# Patient Record
Sex: Male | Born: 1975 | Race: White | Hispanic: No | Marital: Married | Smoking: Never smoker
Health system: Southern US, Community
[De-identification: ages and names within clinical notes are randomized; demographics above are authoritative.]

## PROBLEM LIST (undated history)

## (undated) DIAGNOSIS — E039 Hypothyroidism, unspecified: Secondary | ICD-10-CM

## (undated) DIAGNOSIS — B001 Herpesviral vesicular dermatitis: Secondary | ICD-10-CM

## (undated) DIAGNOSIS — Z973 Presence of spectacles and contact lenses: Secondary | ICD-10-CM

## (undated) DIAGNOSIS — C73 Malignant neoplasm of thyroid gland: Secondary | ICD-10-CM

## (undated) DIAGNOSIS — K219 Gastro-esophageal reflux disease without esophagitis: Secondary | ICD-10-CM

## (undated) HISTORY — DX: Malignant neoplasm of thyroid gland: C73

## (undated) HISTORY — DX: Hypothyroidism, unspecified: E03.9

## (undated) HISTORY — DX: Gastro-esophageal reflux disease without esophagitis: K21.9

## (undated) HISTORY — PX: CLOSED REDUCTION TIBIAL FRACTURE: SHX1361

## (undated) HISTORY — PX: UPPER GI ENDOSCOPY: SHX6162

## (undated) HISTORY — DX: Presence of spectacles and contact lenses: Z97.3

## (undated) HISTORY — DX: Herpesviral vesicular dermatitis: B00.1

---

## 2010-03-27 DIAGNOSIS — C73 Malignant neoplasm of thyroid gland: Secondary | ICD-10-CM

## 2010-03-27 HISTORY — DX: Malignant neoplasm of thyroid gland: C73

## 2010-03-27 HISTORY — PX: THYROIDECTOMY: SHX17

## 2010-07-19 ENCOUNTER — Emergency Department (HOSPITAL_COMMUNITY): Admission: EM | Admit: 2010-07-19 | Discharge: 2010-07-19 | Payer: Self-pay | Admitting: Emergency Medicine

## 2011-09-10 ENCOUNTER — Encounter: Payer: Self-pay | Admitting: Medical

## 2011-09-10 ENCOUNTER — Ambulatory Visit (INDEPENDENT_AMBULATORY_CARE_PROVIDER_SITE_OTHER): Payer: 59 | Admitting: Medical

## 2011-09-10 VITALS — BP 132/80 | HR 80 | Temp 98.1°F | Resp 18 | Ht 71.0 in | Wt 164.0 lb

## 2011-09-10 DIAGNOSIS — Z0000 Encounter for general adult medical examination without abnormal findings: Secondary | ICD-10-CM

## 2011-09-10 DIAGNOSIS — E039 Hypothyroidism, unspecified: Secondary | ICD-10-CM

## 2011-09-10 LAB — POCT URINALYSIS DIPSTICK
Bilirubin, UA: NEGATIVE
Blood, UA: NEGATIVE
Glucose, UA: NEGATIVE
Ketones, UA: NEGATIVE
Spec Grav, UA: <=1.005
pH, UA: 8

## 2011-09-10 NOTE — Progress Notes (Signed)
Subjective:   HPI  Bradley Watts is a 35 y.o. male who presents for a complete physical.  He is a new patient today.   He moved here from [place] a year ago.  Was seeing primary care routine prior in [place].  He recent started working on diet and exercise changes and has lost about 30 lb.  Otherwise has been healthy.  Declines flu shot.  Last tetanus within 10 years. He works as a Comptroller.   Seeing endocrinology in [place].  Just had some recently 1 year f/u and labs for thyroid.  Currently not on his thyroid medication until January for blood test.  Reviewed their medical, surgical, family, social, medication, and allergy history and updated chart as appropriate.   Past Medical History  Diagnosis Date  . 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 Hypothyroidism   . Astigmatism     wears glasses for reading  . GERD (gastroesophageal reflux disease)      Past Surgical History  Procedure Date  . Thyroidectomy 7/11  . Closed reduction tibial fracture     Family History  Problem Relation Age of Onset  . Other Father     died in MVA  . Diabetes Brother     type 1  . Cancer Paternal Grandfather     lung cancer  . Heart disease Neg Hx   . Hyperlipidemia Neg Hx   . Hypertension Neg Hx   . Stroke Neg Hx     History   Social History  . Marital Status: Married    Spouse Name: N/A    Number of Children: N/A  . Years of Education: N/A   Occupational History  . Set designer   Social History Main Topics  . Smoking status: Never Smoker   . Smokeless tobacco: Not on file  . Alcohol Use: 0.6 oz/week    1 Cans of beer per week  . Drug Use: No  . Sexually Active: Not on file   Other Topics Concern  . Not on file   Social History Narrative   Married, 2 children, ages 2yo and [place], exercise - walk the trail    No current outpatient prescriptions on file prior to visit.    No Known  Allergies  Review of Systems Constitutional: -fever, -chills, -sweats, -unexpected weight change, -anorexia, -fatigue Allergy: -sneezing, -itching, -congestion Dermatology: denies changing moles, rash, lumps, new worrisome lesions ENT: -runny nose, -ear pain, -sore throat, -hoarseness, -sinus pain, -teeth pain, -tinnitus, -hearing loss, -epistaxis Cardiology:  -chest pain, -palpitations, -edema, -orthopnea, -paroxysmal nocturnal dyspnea Respiratory: -cough, -shortness of breath, -dyspnea on exertion, -wheezing, -hemoptysis Gastroenterology: -abdominal pain, -nausea, -vomiting, -diarrhea, -constipation, -blood in stool, -changes in bowel movement, -dysphagia Hematology: -bleeding or bruising problems Musculoskeletal: -arthralgias, -myalgias, -joint swelling, -back pain, -neck pain, -cramping, -gait changes Ophthalmology: -vision changes, -eye redness, -itching, -discharge Urology: -dysuria, -difficulty urinating, -hematuria, -urinary frequency, -urgency, incontinence Neurology: -headache, -weakness, -tingling, -numbness, -speech abnormality, -memory loss, -falls, -dizziness Psychology:  -depressed mood, -agitation, -sleep problems     Objective:   Physical Exam  Filed Vitals:   09/10/11 1356  BP: 132/80  Pulse: 80  Temp: 98.1 F (36.7 C)  Resp: 18    General appearance: alert, no distress, WD/WN, white male Skin: anterior neck surgical scar, otherwise few scattered benign appearing macules, otherwise unremarkable HEENT: normocephalic, conjunctiva/corneas normal, sclerae anicteric, PERRLA, EOMi, nares patent, no discharge or erythema, pharynx normal Oral cavity:  MMM, tongue normal, teeth normal Neck: supple, no lymphadenopathy, no thyromegaly, no masses, normal ROM, no bruits Chest: non tender, normal shape and expansion Heart: RRR, normal S1, S2, no murmurs Lungs: CTA bilaterally, no wheezes, rhonchi, or rales Abdomen: +bs, soft, non tender, non distended, no masses, no  hepatomegaly, no splenomegaly, no bruits Back: non tender, normal ROM, no scoliosis Musculoskeletal: upper extremities non tender, no obvious deformity, normal ROM throughout, lower extremities non tender, no obvious deformity, normal ROM throughout Extremities: no edema, no cyanosis, no clubbing Pulses: 2+ symmetric, upper and lower extremities, normal cap refill Neurological: alert, oriented x 3, CN2-12 intact, strength normal upper extremities and lower extremities, sensation normal throughout, DTRs 2+ throughout, no cerebellar signs, gait normal Psychiatric: normal affect, behavior normal, pleasant  GU: normal male external genitalia, nontender, no masses, no hernia, no lymphadenopathy Rectal: deferred   Assessment and Plan :    Encounter Diagnoses  Name Primary?  . Routine general medical examination at a health care facility Yes  . Hypothyroidism     Physical exam - discussed healthy lifestyle, diet, exercise, preventative care, vaccinations, and addressed their concerns.  C/t efforts to lose weight and c/t regular exercise and healthy.    Hypothyroidism - f/u in January as planned with endocrinology   Follow-up for fasting labs

## 2011-09-10 NOTE — Patient Instructions (Signed)
Preventative Care for Adults, Male       REGULAR HEALTH EXAMS:  A routine yearly physical is a good way to check in with your primary care provider about your health and preventive screening. It is also an opportunity to share updates about your health and any concerns you have, and receive a thorough all-over exam.   Most health insurance companies pay for at least some preventative services.  Check with your health plan for specific coverages.  WHAT PREVENTATIVE SERVICES DO MEN NEED?  Adult men should have their weight and blood pressure checked regularly.   Men age 35 and older should have their cholesterol levels checked regularly.  Beginning at age 50 and continuing to age 75, men should be screened for colorectal cancer.  Certain people should may need continued testing until age 85.  Other cancer screening may include exams for testicular and prostate cancer.  Updating vaccinations is part of preventative care.  Vaccinations help protect against diseases such as the flu.  Lab tests are generally done as part of preventative care to screen for anemia and blood disorders, to screen for problems with the kidneys and liver, to screen for bladder problems, to check blood sugar, and to check your cholesterol level.  Preventative services generally include counseling about diet, exercise, avoiding tobacco, drugs, excessive alcohol consumption, and sexually transmitted infections.    GENERAL RECOMMENDATIONS FOR GOOD HEALTH:  Healthy diet:  Eat a variety of foods, including fruit, vegetables, animal or vegetable protein, such as meat, fish, chicken, and eggs, or beans, lentils, tofu, and grains, such as rice.  Drink plenty of water daily.  Decrease saturated fat in the diet, avoid lots of red meat, processed foods, sweets, fast foods, and fried foods.  Exercise:  Aerobic exercise helps maintain good heart health. At least 30-40 minutes of moderate-intensity exercise is recommended.  For example, a brisk walk that increases your heart rate and breathing. This should be done on most days of the week.   Find a type of exercise or a variety of exercises that you enjoy so that it becomes a part of your daily life.  Examples are running, walking, swimming, water aerobics, and biking.  For motivation and support, explore group exercise such as aerobic class, spin class, Zumba, Yoga,or  martial arts, etc.    Set exercise goals for yourself, such as a certain weight goal, walk or run in a race such as a 5k walk/run.  Speak to your primary care provider about exercise goals.  Disease prevention:  If you smoke or chew tobacco, find out from your caregiver how to quit. It can literally save your life, no matter how long you have been a tobacco user. If you do not use tobacco, never begin.   Maintain a healthy diet and normal weight. Increased weight leads to problems with blood pressure and diabetes.   The Body Mass Index or BMI is a way of measuring how much of your body is fat. Having a BMI above 27 increases the risk of heart disease, diabetes, hypertension, stroke and other problems related to obesity. Your caregiver can help determine your BMI and based on it develop an exercise and dietary program to help you achieve or maintain this important measurement at a healthful level.  High blood pressure causes heart and blood vessel problems.  Persistent high blood pressure should be treated with medicine if weight loss and exercise do not work.   Fat and cholesterol leaves deposits in your arteries   that can block them. This causes heart disease and vessel disease elsewhere in your body.  If your cholesterol is found to be high, or if you have heart disease or certain other medical conditions, then you may need to have your cholesterol monitored frequently and be treated with medication.   Ask if you should have a stress test if your history suggests this. A stress test is a test done on  a treadmill that looks for heart disease. This test can find disease prior to there being a problem.  Avoid drinking alcohol in excess (more than two drinks per day).  Avoid use of street drugs. Do not share needles with anyone. Ask for professional help if you need assistance or instructions on stopping the use of alcohol, cigarettes, and/or drugs.  Brush your teeth twice a day with fluoride toothpaste, and floss once a day. Good oral hygiene prevents tooth decay and gum disease. The problems can be painful, unattractive, and can cause other health problems. Visit your dentist for a routine oral and dental check up and preventive care every 6-12 months.   Look at your skin regularly.  Use a mirror to look at your back. Notify your caregivers of changes in moles, especially if there are changes in shapes, colors, a size larger than a pencil eraser, an irregular border, or development of new moles.  Safety:  Use seatbelts 100% of the time, whether driving or as a passenger.  Use safety devices such as hearing protection if you work in environments with loud noise or significant background noise.  Use safety glasses when doing any work that could send debris in to the eyes.  Use a helmet if you ride a bike or motorcycle.  Use appropriate safety gear for contact sports.  Talk to your caregiver about gun safety.  Use sunscreen with a SPF (or skin protection factor) of 15 or greater.  Lighter skinned people are at a greater risk of skin cancer. Don't forget to also wear sunglasses in order to protect your eyes from too much damaging sunlight. Damaging sunlight can accelerate cataract formation.   Practice safe sex. Use condoms. Condoms are used for birth control and to help reduce the spread of sexually transmitted infections (or STIs).  Some of the STIs are gonorrhea (the clap), chlamydia, syphilis, trichomonas, herpes, HPV (human papilloma virus) and HIV (human immunodeficiency virus) which causes AIDS.  The herpes, HIV and HPV are viral illnesses that have no cure. These can result in disability, cancer and death.   Keep carbon monoxide and smoke detectors in your home functioning at all times. Change the batteries every 6 months or use a model that plugs into the wall.   Vaccinations:  Stay up to date with your tetanus shots and other required immunizations. You should have a booster for tetanus every 10 years. Be sure to get your flu shot every year, since 5%-20% of the [place] population comes down with the flu. The flu vaccine changes each year, so being vaccinated once is not enough. Get your shot in the fall, before the flu season peaks.   Other vaccines to consider:  Pneumococcal vaccine to protect against certain types of pneumonia.  This is normally recommended for adults age 65 or older.  However, adults younger than 36 years old with certain underlying conditions such as diabetes, heart or lung disease should also receive the vaccine.  Shingles vaccine to protect against Varicella Zoster if you are older than age 60, or younger   than 36 years old with certain underlying illness.  Hepatitis A vaccine to protect against a form of infection of the liver by a virus acquired from food.  Hepatitis B vaccine to protect against a form of infection of the liver by a virus acquired from blood or body fluids, particularly if you work in health care.  If you plan to travel internationally, check with your local health department for specific vaccination recommendations.  Cancer Screening:  Most routine colon cancer screening begins at the age of 50. On a yearly basis, doctors may provide special easy to use take-home tests to check for hidden blood in the stool. Sigmoidoscopy or colonoscopy can detect the earliest forms of colon cancer and is life saving. These tests use a small camera at the end of a tube to directly examine the colon. Speak to your caregiver about this at age 50, when routine  screening begins (and is repeated every 5 years unless early forms of pre-cancerous polyps or small growths are found).   At the age of 50 men usually start screening for prostate cancer every year. Screening may begin at a younger age for those with higher risk. Those at higher risk include African-Americans or having a family history of prostate cancer. There are two types of tests for prostate cancer:   Prostate-specific antigen (PSA) testing. Recent studies raise questions about prostate cancer using PSA and you should discuss this with your caregiver.   Digital rectal exam (in which your doctor's lubricated and gloved finger feels for enlargement of the prostate through the anus).   Screening for testicular cancer.  Do a monthly exam of your testicles. Gently roll each testicle between your thumb and fingers, feeling for any abnormal lumps. The best time to do this is after a hot shower or bath when the tissues are looser. Notify your caregivers of any lumps, tenderness or changes in size or shape immediately.     

## 2011-09-14 ENCOUNTER — Other Ambulatory Visit: Payer: 59

## 2011-09-14 DIAGNOSIS — Z0000 Encounter for general adult medical examination without abnormal findings: Secondary | ICD-10-CM

## 2011-09-14 LAB — COMPREHENSIVE METABOLIC PANEL
AST: 17 U/L (ref 0–37)
Albumin: 4.2 g/dL (ref 3.5–5.2)
BUN: 21 mg/dL (ref 6–23)
CO2: 29 mEq/L (ref 19–32)
Calcium: 8.9 mg/dL (ref 8.4–10.5)
Chloride: 103 mEq/L (ref 96–112)
Creat: 0.9 mg/dL (ref 0.50–1.35)
Potassium: 4.3 mEq/L (ref 3.5–5.3)

## 2011-09-14 LAB — LIPID PANEL
HDL: 32 mg/dL — ABNORMAL LOW (ref >39)
LDL Cholesterol: 142 mg/dL — ABNORMAL HIGH (ref 0–99)
Triglycerides: 154 mg/dL — ABNORMAL HIGH (ref <150)

## 2011-09-15 LAB — CBC WITH DIFFERENTIAL/PLATELET
Basophils Absolute: 0 10*3/uL (ref 0.0–0.1)
Basophils Relative: 0 % (ref 0–1)
HCT: 48.3 % (ref 39.0–52.0)
Hemoglobin: 16.3 g/dL (ref 13.0–17.0)
Lymphocytes Relative: 40 % (ref 12–46)
MCHC: 33.7 g/dL (ref 30.0–36.0)
Monocytes Absolute: 0.4 10*3/uL (ref 0.1–1.0)
Neutro Abs: 3.1 10*3/uL (ref 1.7–7.7)
Neutrophils Relative %: 52 % (ref 43–77)
RDW: 12.6 % (ref 11.5–15.5)
WBC: 5.9 10*3/uL (ref 4.0–10.5)

## 2011-11-01 ENCOUNTER — Other Ambulatory Visit (INDEPENDENT_AMBULATORY_CARE_PROVIDER_SITE_OTHER): Payer: 59

## 2011-11-01 DIAGNOSIS — R82998 Other abnormal findings in urine: Secondary | ICD-10-CM

## 2011-11-01 DIAGNOSIS — R8290 Unspecified abnormal findings in urine: Secondary | ICD-10-CM

## 2011-11-01 LAB — POCT URINALYSIS DIPSTICK
Bilirubin, UA: NEGATIVE
Blood, UA: NEGATIVE
Ketones, UA: NEGATIVE
pH, UA: 7

## 2012-09-21 ENCOUNTER — Encounter: Payer: Self-pay | Admitting: Internal Medicine

## 2012-10-04 ENCOUNTER — Encounter: Payer: Self-pay | Admitting: Medical

## 2012-10-04 ENCOUNTER — Ambulatory Visit (INDEPENDENT_AMBULATORY_CARE_PROVIDER_SITE_OTHER): Payer: PRIVATE HEALTH INSURANCE | Admitting: Medical

## 2012-10-04 VITALS — BP 110/80 | HR 58 | Temp 98.2°F | Resp 16 | Ht 70.0 in | Wt 221.0 lb

## 2012-10-04 DIAGNOSIS — R001 Bradycardia, unspecified: Secondary | ICD-10-CM

## 2012-10-04 DIAGNOSIS — K219 Gastro-esophageal reflux disease without esophagitis: Secondary | ICD-10-CM

## 2012-10-04 DIAGNOSIS — I498 Other specified cardiac arrhythmias: Secondary | ICD-10-CM

## 2012-10-04 DIAGNOSIS — E039 Hypothyroidism, unspecified: Secondary | ICD-10-CM

## 2012-10-04 DIAGNOSIS — Z0000 Encounter for general adult medical examination without abnormal findings: Secondary | ICD-10-CM

## 2012-10-04 LAB — POCT URINALYSIS DIPSTICK
Bilirubin, UA: NEGATIVE
Blood, UA: NEGATIVE
Glucose, UA: NEGATIVE
Ketones, UA: NEGATIVE
Spec Grav, UA: 1.015

## 2012-10-04 NOTE — Patient Instructions (Addendum)
Dr. Yancey Flemings, dentist [address], [place], [place] [address] [phone] [email or web address]   Continue exercising and healthy diet.  Work towards your goal weight.  Return at your convenience for labs.

## 2012-10-04 NOTE — Progress Notes (Signed)
Subjective: Here for physical.  I saw him as a new patient a year ago.  Last visit he was over 275 lb, and now he is down to 221lb.   He plans to get down to 200lb.  He is exercising regularly.   Runs twice weekly, runs 10 miles every other weekend, and runs 2-3 miles alternate weekends, does weights.  Has really changed diet, eating healthy, and continues to work towards his goal.  2 years ago was not exercising at all.    He continues to see his endocrinologist regularly, had labs 3 months ago.  He is nonfasting today.   Sees eye doctor yearly, last seen 08/2012.  Hasn't been to dentist in a while.    GERD is much better since he has changed diet and lost weight. Declines flu shot.  Last tetanus within 10 years. He works as a Comptroller.   Reviewed their medical, surgical, family, social, medication, and allergy history and updated chart as appropriate.   Past Medical History  Diagnosis Date  . 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 Hypothyroidism   . Astigmatism     wears glasses for reading  . GERD (gastroesophageal reflux disease)      Past Surgical History  Procedure Date  . Thyroidectomy 7/11  . Closed reduction tibial fracture     Family History  Problem Relation Age of Onset  . Other Father     died in MVA  . Diabetes Brother     type 1  . Cancer Paternal Grandfather     lung cancer  . Hyperlipidemia Neg Hx   . Hypertension Neg Hx   . Stroke Neg Hx   . Heart disease Maternal Grandfather 81    History   Social History  . Marital Status: Married    Spouse Name: N/A    Number of Children: N/A  . Years of Education: N/A   Occupational History  . Set designer   Social History Main Topics  . Smoking status: Never Smoker   . Smokeless tobacco: Not on file  . Alcohol Use: 0.6 oz/week    1 Cans of beer per week  . Drug Use: No  . Sexually Active: Not on file   Other Topics Concern   . Not on file   Social History Narrative   Married, 2 children, ages 3yo and 6yo, Comptroller, exercise - running, yoga, weight lifting    Current Outpatient Prescriptions on File Prior to Visit  Medication Sig Dispense Refill  . lansoprazole (PREVACID) 15 MG capsule Take 15 mg by mouth daily.       Marland Kitchen levothyroxine (SYNTHROID, LEVOTHROID) 200 MCG tablet Take 200 mcg by mouth daily.          No Known Allergies  Review of Systems Constitutional: -fever, -chills, -sweats, -unexpected weight change, -anorexia, -fatigue Allergy: -sneezing, -itching, -congestion Dermatology: denies changing moles, rash, lumps, new worrisome lesions ENT: -runny nose, -ear pain, -sore throat, -hoarseness, -sinus pain, -teeth pain, -tinnitus, -hearing loss, -epistaxis Cardiology:  -chest pain, -palpitations, -edema, -orthopnea, -paroxysmal nocturnal dyspnea Respiratory: -cough, -shortness of breath, -dyspnea on exertion, -wheezing, -hemoptysis Gastroenterology: -abdominal pain, -nausea, -vomiting, -diarrhea, -constipation, -blood in stool, -changes in bowel movement, -dysphagia Hematology: -bleeding or bruising problems Musculoskeletal: -arthralgias, -myalgias, -joint swelling, -back pain, -neck pain, -cramping, -gait changes Ophthalmology: -vision changes, -eye redness, -itching, -discharge Urology: -dysuria, -difficulty urinating, -hematuria, -urinary frequency, -urgency, incontinence Neurology: -headache, -  weakness, -tingling, -numbness, -speech abnormality, -memory loss, -falls, -dizziness Psychology:  -depressed mood, -agitation, -sleep problems     Objective:   Physical Exam  Filed Vitals:   10/04/12 1411  BP: 110/80  Pulse: 58  Temp: 98.2 F (36.8 C)  Resp: 16    General appearance: alert, no distress, WD/WN, white male Skin: anterior neck surgical scar, otherwise few scattered benign appearing macules, otherwise unremarkable HEENT: normocephalic, conjunctiva/corneas normal,  sclerae anicteric, PERRLA, EOMi, nares patent, no discharge or erythema, pharynx normal Oral cavity: MMM, tongue normal, teeth in good repair Neck: supple, no lymphadenopathy, no thyromegaly, no masses, normal ROM, no bruits Chest: non tender, normal shape and expansion Heart: RRR, normal S1, S2, no murmurs Lungs: CTA bilaterally, no wheezes, rhonchi, or rales Abdomen: +bs, soft, striae, non tender, non distended, no masses, no hepatomegaly, no splenomegaly, no bruits Back: non tender, normal ROM, no scoliosis, striae  Musculoskeletal: upper extremities non tender, no obvious deformity, normal ROM throughout, lower extremities non tender, no obvious deformity, normal ROM throughout Extremities: no edema, no cyanosis, no clubbing Pulses: 2+ symmetric, upper and lower extremities, normal cap refill Neurological: alert, oriented x 3, CN2-12 intact, strength normal upper extremities and lower extremities, sensation normal throughout, DTRs 2+ throughout, no cerebellar signs, gait normal Psychiatric: normal affect, behavior normal, pleasant  GU: normal male external genitalia, circumcised, nontender, no masses, no hernia, no lymphadenopathy Rectal: deferred   Adult ECG Report  Indication: bradycardia  Rate: 54 bpm  Rhythm: sinus bradycardia  QRS Axis: 63 degrees  PR Interval: 150 ms  QRS Duration: 92ms  QTc:  Conduction Disturbances: none  Other Abnormalities: none  Patient's cardiac risk factors are: male gender.  EKG comparison: none  Narrative Interpretation: sinus bradycardia   Assessment and Plan :    Encounter Diagnoses  Name Primary?  . Routine general medical examination at a health care facility Yes  . Bradycardia   . GERD (gastroesophageal reflux disease)   . Hypothyroidism    Physical exam - discussed healthy lifestyle, diet, exercise, preventative care, vaccinations, and addressed their concerns.  C/t efforts to lose weight and c/t regular exercise and healthy.     Bradycardia - likely multifactorial, conditioning from his aerobic activity, on synthroid, and due to weight loss, no worrisome findings  GERD - c/t current medication, improved with diet changes  Hypothyroidism - followed by endocrinology  Follow-up for fasting labs

## 2012-12-25 ENCOUNTER — Other Ambulatory Visit: Payer: Managed Care, Other (non HMO)

## 2012-12-25 DIAGNOSIS — Z0000 Encounter for general adult medical examination without abnormal findings: Secondary | ICD-10-CM

## 2012-12-25 LAB — LIPID PANEL
Cholesterol: 174 mg/dL (ref 0–200)
Triglycerides: 47 mg/dL (ref <150)
VLDL: 9 mg/dL (ref 0–40)

## 2012-12-25 LAB — CBC WITH DIFFERENTIAL/PLATELET
Basophils Relative: 0 % (ref 0–1)
Hemoglobin: 15.8 g/dL (ref 13.0–17.0)
MCHC: 35.3 g/dL (ref 30.0–36.0)
Monocytes Relative: 6 % (ref 3–12)
Neutro Abs: 1.8 10*3/uL (ref 1.7–7.7)
Neutrophils Relative %: 44 % (ref 43–77)
Platelets: 182 10*3/uL (ref 150–400)
RBC: 5.17 MIL/uL (ref 4.22–5.81)

## 2012-12-25 LAB — COMPREHENSIVE METABOLIC PANEL
Albumin: 4.4 g/dL (ref 3.5–5.2)
BUN: 13 mg/dL (ref 6–23)
Calcium: 9.3 mg/dL (ref 8.4–10.5)
Chloride: 105 mEq/L (ref 96–112)
Glucose, Bld: 82 mg/dL (ref 70–99)
Potassium: 4.5 mEq/L (ref 3.5–5.3)

## 2013-04-23 ENCOUNTER — Other Ambulatory Visit (INDEPENDENT_AMBULATORY_CARE_PROVIDER_SITE_OTHER): Payer: Self-pay

## 2013-04-23 DIAGNOSIS — Z23 Encounter for immunization: Secondary | ICD-10-CM

## 2014-02-14 ENCOUNTER — Encounter: Payer: Self-pay | Admitting: Medical

## 2014-02-14 ENCOUNTER — Ambulatory Visit (INDEPENDENT_AMBULATORY_CARE_PROVIDER_SITE_OTHER): Payer: BC Managed Care – PPO | Admitting: Medical

## 2014-02-14 VITALS — BP 128/86 | HR 60 | Temp 98.0°F | Resp 18 | Ht 70.5 in | Wt 230.0 lb

## 2014-02-14 DIAGNOSIS — K219 Gastro-esophageal reflux disease without esophagitis: Secondary | ICD-10-CM

## 2014-02-14 DIAGNOSIS — E039 Hypothyroidism, unspecified: Secondary | ICD-10-CM

## 2014-02-14 DIAGNOSIS — B001 Herpesviral vesicular dermatitis: Secondary | ICD-10-CM

## 2014-02-14 DIAGNOSIS — Z0000 Encounter for general adult medical examination without abnormal findings: Secondary | ICD-10-CM

## 2014-02-14 DIAGNOSIS — B009 Herpesviral infection, unspecified: Secondary | ICD-10-CM

## 2014-02-14 LAB — BASIC METABOLIC PANEL
BUN: 17 mg/dL (ref 6–23)
CHLORIDE: 101 meq/L (ref 96–112)
CO2: 28 meq/L (ref 19–32)
Calcium: 9.5 mg/dL (ref 8.4–10.5)
Creat: 0.78 mg/dL (ref 0.50–1.35)
GLUCOSE: 94 mg/dL (ref 70–99)
Potassium: 4.4 mEq/L (ref 3.5–5.3)
SODIUM: 136 meq/L (ref 135–145)

## 2014-02-14 LAB — LIPID PANEL
Cholesterol: 178 mg/dL (ref 0–200)
HDL: 43 mg/dL (ref >39)
LDL Cholesterol: 122 mg/dL — ABNORMAL HIGH (ref 0–99)
TRIGLYCERIDES: 63 mg/dL (ref <150)
Total CHOL/HDL Ratio: 4.1 Ratio
VLDL: 13 mg/dL (ref 0–40)

## 2014-02-14 LAB — CBC
HEMATOCRIT: 48.1 % (ref 39.0–52.0)
Hemoglobin: 17 g/dL (ref 13.0–17.0)
MCH: 30.9 pg (ref 26.0–34.0)
MCHC: 35.3 g/dL (ref 30.0–36.0)
MCV: 87.5 fL (ref 78.0–100.0)
Platelets: 180 10*3/uL (ref 150–400)
RBC: 5.5 MIL/uL (ref 4.22–5.81)
RDW: 13 % (ref 11.5–15.5)
WBC: 3.7 10*3/uL — ABNORMAL LOW (ref 4.0–10.5)

## 2014-02-14 LAB — POCT URINALYSIS DIPSTICK
BILIRUBIN UA: NEGATIVE
Blood, UA: NEGATIVE
GLUCOSE UA: NEGATIVE
Ketones, UA: NEGATIVE
LEUKOCYTES UA: NEGATIVE
NITRITE UA: NEGATIVE
Protein, UA: NEGATIVE
Spec Grav, UA: <=1.005
UROBILINOGEN UA: NEGATIVE
pH, UA: 7.5

## 2014-02-14 MED ORDER — AMOXICILLIN-POT CLAVULANATE ER 1000-62.5 MG PO TB12: 2.0000 | ORAL_TABLET | Freq: Two times a day (BID) | ORAL | Status: DC

## 2014-02-14 MED ORDER — DEXLANSOPRAZOLE 60 MG PO CPDR: 60.0000 mg | DELAYED_RELEASE_CAPSULE | Freq: Every day | ORAL | Status: DC

## 2014-02-14 MED ORDER — ACYCLOVIR 400 MG PO TABS: ORAL_TABLET | ORAL | Status: DC

## 2014-02-14 NOTE — Patient Instructions (Signed)
  Thank you for giving me the opportunity to serve you today.    Your diagnosis today includes: Encounter Diagnoses  Name Primary?  . Routine general medical examination at a health care facility Yes  . GERD (gastroesophageal reflux disease)   . Unspecified hypothyroidism   . Recurrent cold sores      Specific recommendations today include:  Begin Dexilant once daily for GERD  Continue Acyclovir, but consider Valtrex as an option  See dentist soon, I listed a dentist below that I prefer.     I have included other useful information below for your review.  Dr. Jonna Coup, dentist [address] [place], [place] [address] [phone] [email or web address]

## 2014-02-14 NOTE — Progress Notes (Signed)
Subjective:   HPI  Bradley Watts is a 38 y.o. male who presents for a complete physical.  Medical care team includes:  Endocrinology  Dorothea Ogle, PA-C here for primary care   Has an employment work form to complete for physical.   Preventative care: Last ophthalmology visit:2014, [facility] Last dental visit:2013 no dentist Last colonoscopy:no Last prostate exam: no Last EKG:2014 Last labs:2015  Prior vaccinations: TD or Tdap:here 2014 Influenza:no Pneumococcal:no Shingles/Zostavax:no Other:   Advanced directive: no Health care power of attorney:no Living will:no  Concerns: Hx/o recurrent cold sores.  Uses acyclovir, would have #30 + 5 refills, but frequency of flare ups varies from time to time.  gerd - using combination of both prevacid and prilosec.  Has failed tums, zantac, other OTC agents.  Sees endocrinology regularly/yearly for thyroid, just had recent labs.   Reviewed their medical, surgical, family, social, medication, and allergy history and updated chart as appropriate.  Past Medical History  Diagnosis Date  . Astigmatism     wears glasses for reading  . GERD (gastroesophageal reflux disease)   . Wears glasses   . Recurrent cold sores   . 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 Jerene Canny  . Hypothyroidism     Past Surgical History  Procedure Laterality Date  . Thyroidectomy  7/11  . Closed reduction tibial fracture      History   Social History  . Marital Status: Married    Spouse Name: N/A    Number of Children: N/A  . Years of Education: N/A   Occupational History  . Designer, industrial/product   Social History Main Topics  . Smoking status: Never Smoker   . Smokeless tobacco: Not on file  . Alcohol Use: 0.6 oz/week    1 Cans of beer per week  . Drug Use: No  . Sexual Activity: Not on file   Other Topics Concern  . Not on file   Social History Narrative   Married,  2 children, ages 58yo and 22yo, [place], exercise - running, yoga, weight lifting    Family History  Problem Relation Age of Onset  . Other Father     died in [place]  . Diabetes Brother     type 1  . Cancer Paternal Grandfather     lung cancer  . Hyperlipidemia Neg Hx   . Hypertension Neg Hx   . Stroke Neg Hx   . Heart disease Maternal Grandfather 20    Current outpatient prescriptions:acyclovir (ZOVIRAX) 400 MG tablet, BID for suppression, TID x 7 days for flare up, Disp: 75 tablet, Rfl: 5;  levothyroxine (SYNTHROID, LEVOTHROID) 200 MCG tablet, Take 200 mcg by mouth daily.  , Disp: , Rfl: ;  dexlansoprazole (DEXILANT) 60 MG capsule, Take 1 capsule (60 mg total) by mouth daily., Disp: 90 capsule, Rfl: 3  No Known Allergies     Review of Systems Constitutional: -fever, -chills, -sweats, -unexpected weight change, -decreased appetite, -fatigue Allergy: -sneezing, -itching, -congestion Dermatology: -changing moles, --rash, -lumps ENT: -runny nose, -ear pain, -sore throat, -hoarseness, -sinus pain, -teeth pain, - ringing in ears, -hearing loss, -nosebleeds Cardiology: -chest pain, -palpitations, -swelling, -difficulty breathing when lying flat, -waking up short of breath Respiratory: -cough, -shortness of breath, -difficulty breathing with exercise or exertion, -wheezing, -coughing up blood Gastroenterology: -abdominal pain, -nausea, -vomiting, -diarrhea, -constipation, -blood in stool, -changes in bowel movement, -difficulty swallowing or eating Hematology: -bleeding, -bruising  Musculoskeletal: -  joint aches, -muscle aches, -joint swelling, -back pain, -neck pain, -cramping, -changes in gait Ophthalmology: denies vision changes, eye redness, itching, discharge Urology: -burning with urination, -difficulty urinating, -blood in urine, -urinary frequency, -urgency, -incontinence Neurology: -headache, -weakness, -tingling, -numbness, -memory loss, -falls, -dizziness Psychology:  -depressed mood, -agitation, -sleep problems     Objective:   Physical Exam  BP 128/86  Pulse 60  Temp(Src) 98 F (36.7 C) (Oral)  Resp 18  Ht 5' 10.5" (1.791 m)  Wt 230 lb (104.327 kg)  BMI 32.52 kg/m2  General appearance: alert, no distress, WD/WN, white male  Skin: anterior neck surgical scar, otherwise few scattered benign appearing macules, otherwise unremarkable , right anterior lower leg tattoo HEENT: normocephalic, conjunctiva/corneas normal, sclerae anicteric, PERRLA, EOMi, nares patent, no discharge or erythema, pharynx normal  Oral cavity: MMM, tongue normal, teeth in good repair  Neck: supple, anterior surgical scar, no lymphadenopathy, no thyromegaly, no masses, normal ROM, no bruits  Chest: non tender, normal shape and expansion  Heart: RRR, normal S1, S2, no murmurs  Lungs: CTA bilaterally, no wheezes, rhonchi, or rales  Abdomen: +bs, soft, striae, non tender, non distended, no masses, no hepatomegaly, no splenomegaly, no bruits  Back: non tender, normal ROM, no scoliosis, striae  Musculoskeletal: upper extremities non tender, no obvious deformity, normal ROM throughout, lower extremities non tender, no obvious deformity, normal ROM throughout  Extremities: somewhat flushed appearing hands, but no edema, no cyanosis, no clubbing  Pulses: 2+ symmetric, upper and lower extremities, normal cap refill  Neurological: alert, oriented x 3, CN2-12 intact, strength normal upper extremities and lower extremities, sensation normal throughout, DTRs 2+ throughout, no cerebellar signs, gait normal  Psychiatric: normal affect, behavior normal, pleasant  GU: normal male external genitalia, circumcised, nontender, no masses, no hernia, no lymphadenopathy  Rectal: deferred    Assessment and Plan :      Encounter Diagnoses  Name Primary?  . Routine general medical examination at a health care facility Yes  . GERD (gastroesophageal reflux disease)   . Unspecified hypothyroidism    . Recurrent cold sores     Physical exam - discussed healthy lifestyle, diet, exercise, preventative care, vaccinations, and addressed their concerns.   GERD - change to Dexilant. Failed prilosec, prevacid, OTC remedies. Hypothyroidism - managed by endocrinology Cold sore - c/t Acyclovir Follow-up pending labs

## 2014-03-31 ENCOUNTER — Ambulatory Visit (INDEPENDENT_AMBULATORY_CARE_PROVIDER_SITE_OTHER): Payer: BC Managed Care – PPO | Admitting: Internal Medicine

## 2014-03-31 VITALS — BP 144/89 | HR 69 | Temp 98.1°F | Resp 18 | Ht 71.0 in | Wt 233.0 lb

## 2014-03-31 DIAGNOSIS — R369 Urethral discharge, unspecified: Secondary | ICD-10-CM

## 2014-03-31 DIAGNOSIS — R05 Cough: Secondary | ICD-10-CM

## 2014-03-31 DIAGNOSIS — R1310 Dysphagia, unspecified: Secondary | ICD-10-CM

## 2014-03-31 DIAGNOSIS — R059 Cough, unspecified: Secondary | ICD-10-CM

## 2014-03-31 LAB — POCT URINALYSIS DIPSTICK
Bilirubin, UA: NEGATIVE
Glucose, UA: NEGATIVE
KETONES UA: NEGATIVE
Leukocytes, UA: NEGATIVE
Nitrite, UA: NEGATIVE
PH UA: 7.5
PROTEIN UA: NEGATIVE
RBC UA: NEGATIVE
SPEC GRAV UA: 1.01
UROBILINOGEN UA: 0.2

## 2014-03-31 LAB — POCT UA - MICROSCOPIC ONLY
CASTS, UR, LPF, POC: NEGATIVE
CRYSTALS, UR, HPF, POC: NEGATIVE
MUCUS UA: NEGATIVE
RBC, urine, microscopic: NEGATIVE
WBC, UR, HPF, POC: NEGATIVE
YEAST UA: NEGATIVE

## 2014-03-31 MED ORDER — AZITHROMYCIN 1 G PO PACK: 1.0000 g | PACK | Freq: Once | ORAL | Status: DC

## 2014-03-31 NOTE — Progress Notes (Signed)
Subjective:    Patient ID: Bradley Watts, male    DOB: 1976-02-03, 38 y.o.   MRN: [id number]  HPI This chart was scribed for Bradley Lin, MD by Irene Pap, ED Scribe. This patient was seen in room 11 and the patient's care was started at 8:54 AM.  HPI Comments: Bradley Watts is a 38 y.o. male who presents to the [facility] complaining of penile discharge onset one week ago Reports greenish yellow discharge w/dysuria but no freq or stream change Having sore throat, cough, and rhinorrhea 2 days Denies sore throat being out of the ordinary from past sore throats//freq PND w/ AR and ST 2 Denies history of similar symptoms penile Denies testicular pain, penile pain, penile swelling, no dysuria, no hematuria, no fever, no nausea, no vomiting,  New partner 2 mos who is on [facility] with unprotected sexual activity about 3 weeks ago Has not notified partner   Patient Active Problem List   Diagnosis Date Noted  . Routine general medical examination at a health care facility 09/10/2011  . Hypothyroidism 09/10/2011   Past Medical History  Diagnosis Date  . Astigmatism     wears glasses for reading  . GERD (gastroesophageal reflux disease)   . Wears glasses   . Recurrent cold sores   . 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 Jerene Canny  . Hypothyroidism    Past Surgical History  Procedure Laterality Date  . Thyroidectomy  7/11  . Closed reduction tibial fracture     No Known Allergies Prior to Admission medications   Medication Sig Start Date End Date Taking? Authorizing Provider  acyclovir (ZOVIRAX) 400 MG tablet BID for suppression, TID x 7 days for flare up 02/14/14  Yes Camelia Eng Tysinger, PA-C  levothyroxine (SYNTHROID, LEVOTHROID) 200 MCG tablet Take 200 mcg by mouth daily.     Yes Historical Provider, MD   History   Social History  . Marital Status: Married    Spouse Name: N/A    Number of Children: N/A  . Years of  Education: N/A   Occupational History  . Designer, industrial/product   Social History Main Topics  . Smoking status: Never Smoker   . Smokeless tobacco: Not on file  . Alcohol Use: 0.6 oz/week    1 Cans of beer per week  . Drug Use: No  . Sexual Activity: Not on file   Other Topics Concern  . Not on file   Social History Narrative   Married, 2 children, ages 73yo and 76yo, [place], exercise - running, yoga, weight lifting   Review of Systems  Constitutional: Negative for fever.  Gastrointestinal: Negative for nausea and vomiting.  Genitourinary: Positive for discharge. Negative for dysuria, hematuria, penile swelling, penile pain and testicular pain.       Objective:   Physical Exam  Nursing note and vitals reviewed. Constitutional: He is oriented to person, place, and time. He appears well-developed and well-nourished.  HENT:  Head: Normocephalic and atraumatic.  Eyes: EOM are normal.  Neck: Normal range of motion. Neck supple.  Cardiovascular: Normal rate.   Pulmonary/Chest: Effort normal.  Musculoskeletal: Normal range of motion.  Neurological: He is alert and oriented to person, place, and time.  Skin: Skin is warm and dry.  Psychiatric: He has a normal mood and affect. His behavior is normal.   Results for orders placed in visit on 03/31/14  POCT URINALYSIS DIPSTICK  Result Value Ref Range   Color, UA yellow     Clarity, UA clear     Glucose, UA neg     Bilirubin, UA neg     Ketones, UA neg     Spec Grav, UA 1.010     Blood, UA neg     pH, UA 7.5     Protein, UA neg     Urobilinogen, UA 0.2     Nitrite, UA neg     Leukocytes, UA Negative    POCT UA - MICROSCOPIC ONLY      Result Value Ref Range   WBC, Ur, HPF, POC neg     RBC, urine, microscopic neg     Bacteria, U Microscopic trace     Mucus, UA neg     Epithelial cells, urine per micros 0-1     Crystals, Ur, HPF, POC neg     Casts, Ur, LPF, POC neg     Yeast, UA  neg         Assessment & Plan:  Abnormal penile discharge - Plan:GC/Chlamydia Probe Amp  Suspect chlamydia  rec partner notif and eval and Rx///cond til then  Cough Dysphagia/sore throat  Unrelated viral illn  Meds ordered this encounter  Medications  . azithromycin (ZITHROMAX) 1 G powder    Sig: Take 1 packet (1 g total) by mouth once.    Dispense:  1 each    Refill:  0

## 2014-04-01 LAB — GC/CHLAMYDIA PROBE AMP
CT Probe RNA: NEGATIVE
GC Probe RNA: NEGATIVE

## 2014-04-02 ENCOUNTER — Encounter: Payer: Self-pay | Admitting: Medical

## 2014-04-02 ENCOUNTER — Telehealth: Payer: Self-pay | Admitting: Family Medicine

## 2014-04-02 ENCOUNTER — Other Ambulatory Visit: Payer: Self-pay | Admitting: Medical

## 2014-04-02 MED ORDER — VALACYCLOVIR HCL 1 G PO TABS: ORAL_TABLET | ORAL | Status: DC

## 2014-04-02 NOTE — Telephone Encounter (Signed)
His label on his bottle should say BID for prevention, or TID x 1 week for flare up.   Thus, if not labeled as such, have him use 1 tablet TID for 1 week, and then c/t BID for prevention ongoing.

## 2014-04-02 NOTE — Telephone Encounter (Signed)
Patient would like to try Valtrex to see if this will help better with the outbreaks. Please, send the Rx to his pharmacy. CLS

## 2014-04-02 NOTE — Telephone Encounter (Signed)
Patient states that he is taking acyclovir  for preventive care  but he has had a outbreak and wants to know she he change to Valtrex to help with the outbreak. CLS

## 2014-04-02 NOTE — Telephone Encounter (Signed)
Change to valtrex, med sent

## 2014-04-03 ENCOUNTER — Other Ambulatory Visit: Payer: Self-pay | Admitting: Medical

## 2014-04-03 MED ORDER — VALACYCLOVIR HCL 1 G PO TABS: ORAL_TABLET | ORAL | Status: DC

## 2014-08-26 ENCOUNTER — Other Ambulatory Visit: Payer: Self-pay | Admitting: Medical

## 2014-08-26 NOTE — Telephone Encounter (Signed)
Is this okay to refill? 

## 2014-08-28 ENCOUNTER — Encounter: Payer: Self-pay | Admitting: Medical

## 2014-08-29 ENCOUNTER — Other Ambulatory Visit: Payer: Self-pay | Admitting: Medical

## 2014-08-29 MED ORDER — VALACYCLOVIR HCL 1 G PO TABS: 1000.0000 mg | ORAL_TABLET | Freq: Every day | ORAL | Status: DC

## 2015-02-03 ENCOUNTER — Encounter: Payer: Self-pay | Admitting: Medical

## 2015-02-17 ENCOUNTER — Ambulatory Visit (INDEPENDENT_AMBULATORY_CARE_PROVIDER_SITE_OTHER): Payer: Managed Care, Other (non HMO) | Admitting: Medical

## 2015-02-17 ENCOUNTER — Encounter: Payer: Self-pay | Admitting: Medical

## 2015-02-17 VITALS — BP 102/80 | HR 58 | Temp 97.9°F | Resp 16 | Ht 70.5 in | Wt 242.0 lb

## 2015-02-17 DIAGNOSIS — E038 Other specified hypothyroidism: Secondary | ICD-10-CM | POA: Diagnosis not present

## 2015-02-17 DIAGNOSIS — L989 Disorder of the skin and subcutaneous tissue, unspecified: Secondary | ICD-10-CM

## 2015-02-17 DIAGNOSIS — Z0000 Encounter for general adult medical examination without abnormal findings: Secondary | ICD-10-CM | POA: Diagnosis not present

## 2015-02-17 DIAGNOSIS — E669 Obesity, unspecified: Secondary | ICD-10-CM | POA: Insufficient documentation

## 2015-02-17 DIAGNOSIS — Q6689 Other  specified congenital deformities of feet: Secondary | ICD-10-CM

## 2015-02-17 DIAGNOSIS — Q742 Other congenital malformations of lower limb(s), including pelvic girdle: Secondary | ICD-10-CM

## 2015-02-17 DIAGNOSIS — B001 Herpesviral vesicular dermatitis: Secondary | ICD-10-CM | POA: Diagnosis not present

## 2015-02-17 LAB — POCT URINALYSIS DIPSTICK
Bilirubin, UA: NEGATIVE
Blood, UA: NEGATIVE
Glucose, UA: NEGATIVE
KETONES UA: NEGATIVE
Leukocytes, UA: NEGATIVE
Nitrite, UA: NEGATIVE
PH UA: 7
Protein, UA: NEGATIVE
SPEC GRAV UA: 1.015
UROBILINOGEN UA: NEGATIVE

## 2015-02-17 LAB — HEMOGLOBIN A1C
Hgb A1c MFr Bld: 5.4 % (ref <5.7)
Mean Plasma Glucose: 108 mg/dL (ref <117)

## 2015-02-17 LAB — COMPREHENSIVE METABOLIC PANEL
ALK PHOS: 45 U/L (ref 39–117)
ALT: 24 U/L (ref 0–53)
AST: 25 U/L (ref 0–37)
Albumin: 4.7 g/dL (ref 3.5–5.2)
BILIRUBIN TOTAL: 0.7 mg/dL (ref 0.2–1.2)
BUN: 14 mg/dL (ref 6–23)
CO2: 27 meq/L (ref 19–32)
Calcium: 9.2 mg/dL (ref 8.4–10.5)
Chloride: 101 mEq/L (ref 96–112)
Creat: 0.83 mg/dL (ref 0.50–1.35)
GLUCOSE: 81 mg/dL (ref 70–99)
POTASSIUM: 4.1 meq/L (ref 3.5–5.3)
SODIUM: 137 meq/L (ref 135–145)
Total Protein: 7.1 g/dL (ref 6.0–8.3)

## 2015-02-17 LAB — CBC
HCT: 49.2 % (ref 39.0–52.0)
Hemoglobin: 17.2 g/dL — ABNORMAL HIGH (ref 13.0–17.0)
MCH: 31.6 pg (ref 26.0–34.0)
MCHC: 35 g/dL (ref 30.0–36.0)
MCV: 90.3 fL (ref 78.0–100.0)
MPV: 10.3 fL (ref 8.6–12.4)
Platelets: 218 10*3/uL (ref 150–400)
RBC: 5.45 MIL/uL (ref 4.22–5.81)
RDW: 13.6 % (ref 11.5–15.5)
WBC: 5.7 10*3/uL (ref 4.0–10.5)

## 2015-02-17 MED ORDER — HYDROCORTISONE 2.5 % EX CREA: TOPICAL_CREAM | Freq: Two times a day (BID) | CUTANEOUS | Status: DC

## 2015-02-17 MED ORDER — VALACYCLOVIR HCL 1 G PO TABS: ORAL_TABLET | ORAL | Status: DC

## 2015-02-17 NOTE — Patient Instructions (Signed)
Dr. Ilina Xu Civils, dentist [address], [place], [place] [address] [phone] [email or web address]    

## 2015-02-17 NOTE — Progress Notes (Signed)
Subjective:   HPI  Bradley Watts is a 39 y.o. male who presents for a complete physical.  Starts new job for [employer] in 2 wk.  Medical care team/other doctors includes:  Dr. Forde Dandy, endocrinology   Preventative care: Last physical or labs: 01/2014 Sees dentist yearly: No Last tetanus vaccine, TD or Tdap: 2014 Eye- Yes- [facility]   Concerns: Right great toenail with crack, new nail growing in, stumped toe a while back  Has pimple like lesion of right lower lip, x 44mo won't heal  Still doing well with Valtrex   Reviewed their medical, surgical, family, social, medication, and allergy history and updated chart as appropriate.  Past Medical History  Diagnosis Date  . Astigmatism     wears glasses for reading  . GERD (gastroesophageal reflux disease)   . Wears glasses   . Recurrent cold sores   . 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 Jerene Canny  . Hypothyroidism     Past Surgical History  Procedure Laterality Date  . Thyroidectomy  7/11  . Closed reduction tibial fracture      left    History   Social History  . Marital Status: Married    Spouse Name: N/A  . Number of Children: N/A  . Years of Education: N/A   Occupational History  . Designer, industrial/product   Social History Main Topics  . Smoking status: Never Smoker   . Smokeless tobacco: Not on file  . Alcohol Use: 0.6 oz/week    1 Cans of beer per week  . Drug Use: No  . Sexual Activity: Not on file   Other Topics Concern  . Not on file   Social History Narrative   Divorced; new relationship 01/2015, 2 children, ages 52yo and 47yo, [place], exercise - running 45 minutes 6 days per week including yoga, weight lifting    Family History  Problem Relation Age of Onset  . Other Father     died in [place]  . Diabetes Brother     type 1  . Cancer Paternal Grandfather     lung cancer  . Hyperlipidemia Neg Hx   . Hypertension Neg Hx    . Stroke Neg Hx   . Heart disease Maternal Grandfather 80     Current outpatient prescriptions:  .  levothyroxine (SYNTHROID, LEVOTHROID) 200 MCG tablet, Take 200 mcg by mouth daily.  , Disp: , Rfl:  .  valACYclovir (VALTREX) 1000 MG tablet, 1 tablet daily for suppression, 2 tablet BID for 1 day for flare up, Disp: 45 tablet, Rfl: 11 .  hydrocortisone 2.5 % cream, Apply topically 2 (two) times daily., Disp: 30 g, Rfl: 0  No Known Allergies  Review of Systems Constitutional: -fever, -chills, -sweats, -unexpected weight change, -decreased appetite, -fatigue Allergy: -sneezing, -itching, -congestion Dermatology: -changing moles, --rash, -lumps ENT: -runny nose, -ear pain, -sore throat, -hoarseness, -sinus pain, -teeth pain, - ringing in ears, -hearing loss, -nosebleeds Cardiology: -chest pain, -palpitations, -swelling, -difficulty breathing when lying flat, -waking up short of breath Respiratory: -cough, -shortness of breath, -difficulty breathing with exercise or exertion, -wheezing, -coughing up blood Gastroenterology: -abdominal pain, -nausea, -vomiting, -diarrhea, -constipation, -blood in stool, -changes in bowel movement, -difficulty swallowing or eating Hematology: -bleeding, -bruising  Musculoskeletal: -joint aches, -muscle aches, -joint swelling, -back pain, -neck pain, -cramping, -changes in gait Ophthalmology: denies vision changes, eye redness, itching, discharge Urology: -burning with urination, -difficulty urinating, -blood  in urine, -urinary frequency, -urgency, -incontinence Neurology: -headache, -weakness, -tingling, -numbness, -memory loss, -falls, -dizziness Psychology: -depressed mood, -agitation, -sleep problems     Objective:   Physical Exam  BP 102/80 mmHg  Pulse 58  Temp(Src) 97.9 F (36.6 C) (Oral)  Resp 16  Ht 5' 10.5" (1.791 m)  Wt 242 lb (109.77 kg)  BMI 34.22 kg/m2  Wt Readings from Last 3 Encounters:  02/17/15 242 lb (109.77 kg)  03/31/14 233 lb  (105.688 kg)  02/14/14 230 lb (104.327 kg)   General appearance: alert, no distress, WD/WN, white male  Skin: anterior neck surgical scar, otherwise few scattered benign appearing macules, otherwise unremarkable , right anterior lower leg tattoo, right face just lower to right lower lip with 48mm raised erythematous lesion, nonspecific, right toenail proximal nail is fresh new nail, there is a border of raised nail at proximal 1/3 of nail, but no loose nail, otherwise nails normal appearing HEENT: normocephalic, conjunctiva/corneas normal, sclerae anicteric, PERRLA, EOMi, nares patent, no discharge or erythema, pharynx normal  Oral cavity: MMM, tongue normal, teeth in good repair  Neck: supple, anterior surgical scar, no lymphadenopathy, no thyromegaly, no masses, normal ROM, no bruits  Chest: non tender, normal shape and expansion  Heart: RRR, normal S1, S2, no murmurs  Lungs: CTA bilaterally, no wheezes, rhonchi, or rales  Abdomen: +bs, soft, striae, non tender, non distended, no masses, no hepatomegaly, no splenomegaly, no bruits  Back: non tender, normal ROM, no scoliosis, striae  Musculoskeletal: upper extremities non tender, no obvious deformity, normal ROM throughout, lower extremities non tender, no obvious deformity, normal ROM throughout  Extremities: somewhat flushed appearing hands, but no edema, no cyanosis, no clubbing  Pulses: 2+ symmetric, upper and lower extremities, normal cap refill  Neurological: alert, oriented x 3, CN2-12 intact, strength normal upper extremities and lower extremities, sensation normal throughout, DTRs 2+ throughout, no cerebellar signs, gait normal  Psychiatric: normal affect, behavior normal, pleasant  GU: normal male external genitalia, circumcised, nontender, no masses, no hernia, no lymphadenopathy  Rectal: deferred    Assessment and Plan :    Encounter Diagnoses  Name Primary?  . Encounter for health maintenance examination in  adult Yes  . Skin lesion   . Toe anomaly   . Obesity   . Other specified hypothyroidism   . Recurrent cold sores     Physical exam - discussed healthy lifestyle, diet, exercise, preventative care, vaccinations, and addressed their concerns.   See your dentist yearly for routine dental care including hygiene visits twice yearly. See your eye doctor yearly for routine vision care. Routine labs today.  Vaccinations: Advised yearly flu vaccine.   Other concerns today: Toe anomaly - advised that new nail is growing in.   Old nail may fall off, will remain to be seen.  Reassured.  Advised he cut toenails straight across. Facial/lip lesion - 1-2 wk of Hydrocortisone, and if not improving in 1-2 wk, call back Obesity - c/t efforts with exercise, healthy diet Hypothyroidism - sees endocrinology Recurrent cold sores - c/t Valtrex (cuts valtrex in half for 500mg  daily for suppression  Follow up pending labs

## 2015-05-14 ENCOUNTER — Encounter: Payer: Self-pay | Admitting: Medical

## 2015-05-15 ENCOUNTER — Other Ambulatory Visit: Payer: Self-pay | Admitting: Medical

## 2015-05-15 MED ORDER — DEXLANSOPRAZOLE 60 MG PO CPDR: 60.0000 mg | DELAYED_RELEASE_CAPSULE | Freq: Every day | ORAL | Status: DC

## 2015-05-15 MED ORDER — VALACYCLOVIR HCL 1 G PO TABS: ORAL_TABLET | ORAL | Status: DC

## 2015-06-23 ENCOUNTER — Encounter: Payer: Self-pay | Admitting: Family Medicine

## 2015-06-23 ENCOUNTER — Ambulatory Visit (INDEPENDENT_AMBULATORY_CARE_PROVIDER_SITE_OTHER): Payer: Managed Care, Other (non HMO) | Admitting: Family Medicine

## 2015-06-23 VITALS — BP 136/78 | HR 64 | Temp 98.1°F | Wt 247.8 lb

## 2015-06-23 DIAGNOSIS — H6592 Unspecified nonsuppurative otitis media, left ear: Secondary | ICD-10-CM | POA: Diagnosis not present

## 2015-06-23 NOTE — Progress Notes (Signed)
   Subjective:    Patient ID: Steel Kerney, male    DOB: [birth year], 39 y.o.   MRN: [id number]  HPI He is a pleasant 39 year old who is here for an acute visit. He states he had an ear infection that was diagnosed by his Endocrinologist 5 weeks ago and he was treated with antibiotics. States ear pain did get much better however he continues to feel some pressure to the left ear. He states he can make it pop by yawning or chewing gum. He has been using a homeopathic oil for swimmers ear otc.  Here because he is traveling via airplane for next few days for work and wanted to make sure his ear is ok.  Denies fever, chills, cough, sore throat, or reduced hearing.    Review of Systems Pertinent positives and negatives in the history of present illness.    Objective:   Physical Exam  Constitutional: He appears well-developed and well-nourished. No distress.  HENT:  Right Ear: Hearing, tympanic membrane, external ear and ear canal normal.  Left Ear: Hearing, external ear and ear canal normal. Tympanic membrane is not perforated, not erythematous, not retracted and not bulging. A middle ear effusion is present.  Nose: Nose normal.  Mouth/Throat: Uvula is midline, oropharynx is clear and moist and mucous membranes are normal. No oropharyngeal exudate, posterior oropharyngeal edema or posterior oropharyngeal erythema.  Lymphadenopathy:    He has no cervical adenopathy.       Right: No supraclavicular adenopathy present.       Left: No supraclavicular adenopathy present.          Assessment & Plan:  Middle ear effusion, left  Recommend using Afrin nasal spray twice daily for no more than 3 days. He verbalized understanding of these instructions. Discussed rebound congestion and addiction potential of Afrin. Recommend that he chew gum while on the plane especially during the descent. Consulted with Dr. Redmond School regarding the treatment plan for this patient.

## 2015-06-23 NOTE — Patient Instructions (Signed)
Use Afrin twice daily ONLY for 3 days then stop.   Oxymetazoline nasal spray What is this medicine? Oxymetazoline (OX ee me TAZ oh leen) is a nasal decongestant. This medicine is used to treat nasal congestion or a stuffy nose. This medicine will not treat an infection. This medicine may be used for other purposes; ask your health care provider or pharmacist if you have questions. COMMON BRAND NAME(S): 12 Hour Nasal, Afrin, Afrin Extra Moisturizing, Afrin Nasal Sinus, Dristan, Duration, Genasal, Mucinex Full Force, Mucinex Moisture Smart, Mucinex Sinus-Max, Nasal Relief, Neo-Synephrine 12-Hour, Neo-Synephrine Severe Sinus Congestion, Sinex 12-Hour, Sudafed OM Sinus Cold Moisturizing, Sudafed OM Sinus Congestion Moisturizing, Vicks Qlearquil Decongestant, Vicks Sinex, Zicam Extreme Congestion Relief, Zicam Intense Sinus What should I tell my health care provider before I take this medicine? They need to know if you have any of these conditions: -diabetes -heart disease -high blood pressure -thyroid disease -trouble urinating due to an enlarged prostate gland -an unusual or allergic reaction to oxymetazoline, other medicines, foods, dyes, or preservatives -pregnant or trying to get pregnant -breast-feeding How should I use this medicine? This medicine is for use in the nose. Do not take by mouth. Follow the directions on the package label. Shake well before using. Use your medicine at regular intervals or as directed by your health care provider. Do not use it more often than directed. Do not use for more than 3 days in a row without advice. Make sure that you are using your nasal spray correctly. Ask your doctor or health care provider if you have any questions. Talk to your pediatrician regarding the use of this medicine in children. While this drug may be prescribed for children for selected conditions, precautions do apply. Overdosage: If you think you've taken too much of this medicine  contact a poison control center or emergency room at once. Overdosage: If you think you have taken too much of this medicine contact a poison control center or emergency room at once. NOTE: This medicine is only for you. Do not share this medicine with others. What if I miss a dose? If you miss a dose, use it as soon as you can. If it is almost time for your next dose, use only that dose. Do not use double or extra doses. What may interact with this medicine? Do not take this medicine with any of the following medications: -MAOIs like Marplan, Nardil, and Parnate This list may not describe all possible interactions. Give your health care provider a list of all the medicines, herbs, non-prescription drugs, or dietary supplements you use. Also tell them if you smoke, drink alcohol, or use illegal drugs. Some items may interact with your medicine. What should I watch for while using this medicine? Tell your doctor or healthcare professional if your symptoms do not start to get better or if they get worse. Do not share this bottle with anyone else as this may spread germs. What side effects may I notice from receiving this medicine? Side effects that you should report to your doctor or health care professional as soon as possible: -allergic reactions like skin rash, itching or hives, swelling of the face, lips, or tongue Side effects that usually do not require medical attention (Report these to your doctor or health care professional if they continue or are bothersome.): -burning, stinging, or irritation in the nose right after use -increased nasal discharge -sneezing This list may not describe all possible side effects. Call your doctor for medical advice about  side effects. You may report side effects to FDA at 1-800-FDA-1088. Where should I keep my medicine? Keep out of the reach of children. Store at room temperature between 20 and 25 degrees C (68 and 77 degrees F). Throw away any unused  medicine after the expiration date. NOTE: This sheet is a summary. It may not cover all possible information. If you have questions about this medicine, talk to your doctor, pharmacist, or health care provider.  2015, Elsevier/[employer] Standard. (2011-04-14 14:11:31)

## 2015-10-10 ENCOUNTER — Other Ambulatory Visit: Payer: Self-pay | Admitting: Medical

## 2015-10-22 ENCOUNTER — Encounter: Payer: Self-pay | Admitting: Medical

## 2015-10-22 ENCOUNTER — Ambulatory Visit (INDEPENDENT_AMBULATORY_CARE_PROVIDER_SITE_OTHER): Payer: Managed Care, Other (non HMO) | Admitting: Medical

## 2015-10-22 VITALS — BP 128/80 | HR 60 | Wt 253.0 lb

## 2015-10-22 DIAGNOSIS — B354 Tinea corporis: Secondary | ICD-10-CM | POA: Diagnosis not present

## 2015-10-22 DIAGNOSIS — L720 Epidermal cyst: Secondary | ICD-10-CM

## 2015-10-22 MED ORDER — CLOTRIMAZOLE-BETAMETHASONE 1-0.05 % EX CREA: 1.0000 "application " | TOPICAL_CREAM | Freq: Two times a day (BID) | CUTANEOUS | Status: DC

## 2015-10-22 NOTE — Progress Notes (Signed)
Subjective: Chief Complaint  Patient presents with  . ring worm?    states that is comes and goes. puts lotriman on it. states it itches when it is prevalent. states it is not itching today.   Wants contact info for my dentist recommendations.    Thinks he has ringworm on his left clavicle area.  Uses lotrimin cream, seems to get better after using it for a week at a time.   Doesn't seem to go completely away.   Also gets itchy spots in temples of scalp too.   He has a beard.  The rash can be more red and border raised than current patch.    Feels like he has a fat deposit in lower back.  It moves around.  Been there at least a year, but not painful.   girlfriend wants him to get it checked out.     Past Medical History  Diagnosis Date  . Astigmatism     wears glasses for reading  . GERD (gastroesophageal reflux disease)   . Wears glasses   . Recurrent cold sores   . 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 Jerene Canny  . Hypothyroidism    ROS as in subjective   Objective: BP 128/80 mmHg  Pulse 60  Wt 253 lb (114.76 kg)  Gen: wd, wn, nad Skin: left upper chest over medial clavicle with 4cm oval patch of pink/red coloration.   Right low back over paraspinal muscles with 4.5 cm roundish mobile mass that seems fluid filled, nontender. Similar smaller 2.5 cm diameter mass over left paraspinal area, both subcutaneous   Assessment:. Encounter Diagnoses  Name Primary?  . Tinea corporis Yes  . Cyst of skin and subcutaneous tissue      Plan: Tinea - begin Lotrisone cream, c/t for a week after rash has resolved.  Avoid over use.  advised selsun blue massage in scalp and beard 10 minutes at a time once weekly.   Recheck if not resolving  Cyst of skin - appears to be cyst, no worrisome findings .  Offered imaging vs surgical consult if desired.  For now, we will just monitor and leave them alone.

## 2015-10-22 NOTE — Patient Instructions (Signed)
Dr. David Civils, dentist [address], [place], [place] [address] [phone] [email or web address]    

## 2015-12-29 ENCOUNTER — Other Ambulatory Visit: Payer: Self-pay | Admitting: Medical

## 2016-03-08 ENCOUNTER — Ambulatory Visit (INDEPENDENT_AMBULATORY_CARE_PROVIDER_SITE_OTHER): Payer: Managed Care, Other (non HMO) | Admitting: Medical

## 2016-03-08 ENCOUNTER — Encounter: Payer: Self-pay | Admitting: Medical

## 2016-03-08 VITALS — BP 116/80 | HR 61 | Temp 98.2°F | Wt 241.0 lb

## 2016-03-08 DIAGNOSIS — J0110 Acute frontal sinusitis, unspecified: Secondary | ICD-10-CM | POA: Diagnosis not present

## 2016-03-08 DIAGNOSIS — K219 Gastro-esophageal reflux disease without esophagitis: Secondary | ICD-10-CM | POA: Diagnosis not present

## 2016-03-08 DIAGNOSIS — B001 Herpesviral vesicular dermatitis: Secondary | ICD-10-CM

## 2016-03-08 MED ORDER — VALACYCLOVIR HCL 1 G PO TABS: ORAL_TABLET | ORAL | Status: DC

## 2016-03-08 MED ORDER — AMOXICILLIN-POT CLAVULANATE 875-125 MG PO TABS: 1.0000 | ORAL_TABLET | Freq: Two times a day (BID) | ORAL | Status: DC

## 2016-03-08 MED ORDER — DEXLANSOPRAZOLE 60 MG PO CPDR: DELAYED_RELEASE_CAPSULE | ORAL | Status: DC

## 2016-03-08 NOTE — Progress Notes (Signed)
Subjective:  Bradley Watts is a 40 y.o. male who presents for possible sinus infection.   He came down with a cold 2 weeks ago, but since then has had lingering congestion.   Now he reports symptoms include having URI 2 weeks ago, but he has continued to have green mucous drainage, coughing, head congestion, sinus headache.  He is using motrin for head pressure.   He had to go to [place] recently for business, and going back soon, worried about sinus pressure with the air line pressure changes.  He is a nonsmoker.   Has 1 recent sick contact with URI symptoms.  No other aggravating or relieving factors. No other complaint.  Needs refills on some medications.  They are working fine regarding Valtrex and Dexilant for GERD.  No other aggravating or relieving factors.  No other c/o.  Past Medical History  Diagnosis Date  . Astigmatism     wears glasses for reading  . GERD (gastroesophageal reflux disease)   . Wears glasses   . Recurrent cold sores   . Papillary carcinoma of thyroid ([facility]) 03/2010    subsequent thyroidectomy; Dr. Jerene Canny  . Hypothyroidism     ROS as in subjective   Objective: BP 116/80 mmHg  Pulse 61  Temp(Src) 98.2 F (36.8 C) (Tympanic)  Wt 241 lb (109.317 kg)  General appearance: Alert, WD/WN, no distress                             Skin: warm, no rash                           Head: + frontal sinus tenderness,                            Eyes: conjunctiva normal, corneas clear, PERRLA                            Ears: pearly TMs, external ear canals normal                          Nose: septum midline, turbinates swollen, with erythema and mucoid discharge             Mouth/throat: MMM, tongue normal, mild pharyngeal erythema                           Neck: supple, no adenopathy, no thyromegaly, non tender                         Lungs: CTA bilaterally, no wheezes, rales, or rhonchi      Assessment  Encounter Diagnoses  Name Primary?  . Acute frontal  sinusitis, recurrence not specified Yes  . Recurrent cold sores   . Gastroesophageal reflux disease without esophagitis       Plan: discussed diagnosis of sinusitis, treatment recommendations Specific home care recommendations today include:  Only take over-the-counter (OTC) or prescription medicines for pain, discomfort, or fever as directed by your caregiver.    Decongestant: You may use OTC Guaifenesin (Mucinex plain) for congestion.  You may use Pseudoephedrine (Sudafed) only if you don't have blood pressure problems or a diagnosis of hypertension.  Cough suppression: If you have cough from drainage,  you may use over-the-counter Dextromethorphan (Delsym) as directed on the label  Pain/fever relief: You may use over-the-counter Tylenol for pain or fever  Drink extra fluids. Fluids help thin the mucus so your sinuses can drain more easily.   Applying either moist heat or ice packs to the sinus areas may help relieve discomfort.  Use saline nasal sprays to help moisten your sinuses. The sprays can be found at your local drugstore.   GERD - advised GI consult given long term use of PPI.  He had EGD in his teens but none since.  He will consider and let me know  Cold sores - doing fine on medication for suppression    Bradley Watts was seen today for sinus infection?.  Diagnoses and all orders for this visit:  Acute frontal sinusitis, recurrence not specified  Recurrent cold sores  Gastroesophageal reflux disease without esophagitis  Other orders -     amoxicillin-clavulanate (AUGMENTIN) 875-125 MG tablet; Take 1 tablet by mouth 2 (two) times daily. -     valACYclovir (VALTREX) 1000 MG tablet; 1 tablet daily for suppression, 2 tablet BID for 1 day for flare up -     dexlansoprazole (DEXILANT) 60 MG capsule; TAKE 1 CAPSULE (60 MG TOTAL) BY MOUTH DAILY.   Patient was advised to call or return if worse or not improving in the next few days.    Patient voiced understanding of  diagnosis, recommendations, and treatment plan.

## 2016-08-22 ENCOUNTER — Other Ambulatory Visit: Payer: Self-pay | Admitting: Medical

## 2016-09-28 ENCOUNTER — Encounter: Payer: Self-pay | Admitting: Medical

## 2016-09-28 ENCOUNTER — Ambulatory Visit (INDEPENDENT_AMBULATORY_CARE_PROVIDER_SITE_OTHER): Payer: Commercial Managed Care - PPO | Admitting: Medical

## 2016-09-28 VITALS — BP 110/66 | HR 85 | Temp 98.5°F | Wt 220.4 lb

## 2016-09-28 DIAGNOSIS — J988 Other specified respiratory disorders: Secondary | ICD-10-CM

## 2016-09-28 DIAGNOSIS — H6503 Acute serous otitis media, bilateral: Secondary | ICD-10-CM

## 2016-09-28 DIAGNOSIS — Z202 Contact with and (suspected) exposure to infections with a predominantly sexual mode of transmission: Secondary | ICD-10-CM | POA: Diagnosis not present

## 2016-09-28 MED ORDER — AMOXICILLIN-POT CLAVULANATE 875-125 MG PO TABS: 1.0000 | ORAL_TABLET | Freq: Two times a day (BID) | ORAL | 0 refills | Status: DC

## 2016-09-28 NOTE — Progress Notes (Signed)
Subjective: Chief Complaint  Patient presents with  . congestion, coughing    congestion, coughing ,runny nose    Here for cough, congestion.  Typically gets sick in winter.   He reports about a week hx/o cough, congestion, runny nose.  Was in a musty old room in [place] at his brother's house.   Morning coughing up green phlegm.  No fever, no NVD, no sore, no ear pain.   No body aches, no chills.  Sinuses feel stuffy.  Using nothing for symptoms.  No sick contacts.  No other aggravating or relieving factors.   Was dating Katie for a while, but broke up over a month ago.  Wants STD screen to be sure, thinks she may have slept with someone else.  Has some new goals, new relationship, more weight loss.   Past Medical History:  Diagnosis Date  . Astigmatism    wears glasses for reading  . GERD (gastroesophageal reflux disease)   . Hypothyroidism   . Papillary carcinoma of thyroid ([facility]) 03/2010   subsequent thyroidectomy; Dr. Jerene Canny  . Recurrent cold sores   . Wears glasses    Current Outpatient Prescriptions on File Prior to Visit  Medication Sig Dispense Refill  . DEXILANT 60 MG capsule TAKE 1 CAPSULE (60 MG TOTAL) BY MOUTH DAILY. 90 capsule 1  . levothyroxine (SYNTHROID, LEVOTHROID) 200 MCG tablet Take 200 mcg by mouth daily.      . valACYclovir (VALTREX) 1000 MG tablet 1 tablet daily for suppression, 2 tablet BID for 1 day for flare up 90 tablet 3   No current facility-administered medications on file prior to visit.    ROS as in subjective   Objective: BP 110/66   Pulse 85   Temp 98.5 F (36.9 C)   Wt 220 lb 6.4 oz (100 kg)   SpO2 98%   BMI 31.18 kg/m   General appearance: alert, no distress, WD/WN HEENT: normocephalic, sclerae anicteric, TMs with serous effusions bilat, nares patent, clear discharge mild erythema, pharynx normal Oral cavity: MMM, no lesions Neck: supple, no lymphadenopathy, no thyromegaly, no masses Heart: RRR, normal S1, S2, no murmurs Lungs: CTA  bilaterally, no wheezes, rhonchi, or rales Abdomen: +bs, soft, non tender, non distended, no masses, no hepatomegaly, no splenomegaly Pulses: 2+ symmetric, upper and lower extremities, normal cap refill    Assessment: Encounter Diagnoses  Name Primary?  Marland Kitchen Respiratory tract infection Yes  . Bilateral acute serous otitis media, recurrence not specified   . Venereal disease contact      Plan: Begin Dayquil,Nyquil combo, rest, hydrate well and begin Augmentin.    STD screen today, advised safe sex, condom use, discussed availability of PreP.  F/u if worse or not improving.

## 2016-09-29 LAB — GC/CHLAMYDIA PROBE AMP
CT Probe RNA: NOT DETECTED
GC PROBE AMP APTIMA: NOT DETECTED

## 2016-09-29 LAB — HIV ANTIBODY (ROUTINE TESTING W REFLEX): HIV: NONREACTIVE

## 2016-09-29 LAB — RPR

## 2017-02-14 ENCOUNTER — Other Ambulatory Visit: Payer: Self-pay | Admitting: Medical

## 2017-08-12 ENCOUNTER — Other Ambulatory Visit: Payer: Self-pay | Admitting: Medical

## 2017-08-15 NOTE — Telephone Encounter (Signed)
Called  Spoke with pt about refill and setting up an appt for physical , pt is going to call back an make an appt.

## 2017-09-05 ENCOUNTER — Encounter: Payer: Commercial Managed Care - PPO | Admitting: Medical

## 2017-09-13 ENCOUNTER — Other Ambulatory Visit: Payer: Self-pay | Admitting: Medical

## 2017-09-15 ENCOUNTER — Encounter: Payer: Self-pay | Admitting: Medical

## 2017-09-15 ENCOUNTER — Ambulatory Visit: Payer: Commercial Managed Care - PPO | Admitting: Medical

## 2017-09-15 VITALS — BP 136/80 | HR 61 | Ht 72.0 in | Wt 249.0 lb

## 2017-09-15 DIAGNOSIS — E039 Hypothyroidism, unspecified: Secondary | ICD-10-CM

## 2017-09-15 DIAGNOSIS — Z0000 Encounter for general adult medical examination without abnormal findings: Secondary | ICD-10-CM | POA: Diagnosis not present

## 2017-09-15 DIAGNOSIS — K921 Melena: Secondary | ICD-10-CM | POA: Diagnosis not present

## 2017-09-15 DIAGNOSIS — K219 Gastro-esophageal reflux disease without esophagitis: Secondary | ICD-10-CM | POA: Diagnosis not present

## 2017-09-15 DIAGNOSIS — Z2821 Immunization not carried out because of patient refusal: Secondary | ICD-10-CM | POA: Insufficient documentation

## 2017-09-15 DIAGNOSIS — B001 Herpesviral vesicular dermatitis: Secondary | ICD-10-CM | POA: Diagnosis not present

## 2017-09-15 DIAGNOSIS — L309 Dermatitis, unspecified: Secondary | ICD-10-CM | POA: Diagnosis not present

## 2017-09-15 DIAGNOSIS — B360 Pityriasis versicolor: Secondary | ICD-10-CM

## 2017-09-15 DIAGNOSIS — Z113 Encounter for screening for infections with a predominantly sexual mode of transmission: Secondary | ICD-10-CM

## 2017-09-15 LAB — POCT URINALYSIS DIP (PROADVANTAGE DEVICE)
Bilirubin, UA: NEGATIVE
Glucose, UA: NEGATIVE mg/dL
Ketones, POC UA: NEGATIVE mg/dL
Leukocytes, UA: NEGATIVE
NITRITE UA: NEGATIVE
PH UA: 7 (ref 5.0–8.0)
Protein Ur, POC: NEGATIVE mg/dL
RBC UA: NEGATIVE
Specific Gravity, Urine: 1.02
UUROB: NEGATIVE

## 2017-09-15 MED ORDER — CLOTRIMAZOLE-BETAMETHASONE 1-0.05 % EX CREA: 1.0000 "application " | TOPICAL_CREAM | Freq: Two times a day (BID) | CUTANEOUS | 0 refills | Status: DC

## 2017-09-15 MED ORDER — DEXLANSOPRAZOLE 60 MG PO CPDR: 60.0000 mg | DELAYED_RELEASE_CAPSULE | Freq: Every day | ORAL | 3 refills | Status: DC

## 2017-09-15 MED ORDER — VALACYCLOVIR HCL 1 G PO TABS: ORAL_TABLET | ORAL | 3 refills | Status: DC

## 2017-09-15 NOTE — Progress Notes (Signed)
Subjective:   HPI  Bradley Watts is a 41 y.o. male who presents for a complete physical.    Medical care team/other doctors includes:  Dr. Forde Dandy, endocrinology  Tripp Goins, Camelia Eng, PA-C here for primary care   Concerns:  Has seen a few times of [facility] in last 3 months.  Both times was after hard BM and having a day or 2 of rough BMs and constipated.  In general rarely gets constipated.   Both times it was a moderate amount of blood, some mixed in with stool as well.   Only happened twice though.  None recently  Otherwise in normal state of health  Wants refills on cream for rash he gets occasionally on chest.  Still uses Dexilant regularly for GERD  Reviewed their medical, surgical, family, social, medication, and allergy history and updated chart as appropriate.  Past Medical History:  Diagnosis Date  . Astigmatism    wears glasses for reading  . GERD (gastroesophageal reflux disease)   . Hypothyroidism   . Papillary carcinoma of thyroid ([facility]) 03/2010   subsequent thyroidectomy; Dr. Jerene Canny  . Recurrent cold sores   . Wears glasses     Past Surgical History:  Procedure Laterality Date  . CLOSED REDUCTION TIBIAL FRACTURE     left  . THYROIDECTOMY  7/11    Social History   Socioeconomic History  . Marital status: Married    Spouse name: Not on file  . Number of children: Not on file  . Years of education: Not on file  . Highest education level: Not on file  Social Needs  . Financial resource strain: Not on file  . Food insecurity - worry: Not on file  . Food insecurity - inability: Not on file  . Transportation needs - medical: Not on file  . Transportation needs - non-medical: Not on file  Occupational History  . Occupation: Therapist, sports: [employer]    Comment: Radiographer, therapeutic  Tobacco Use  . Smoking status: Never Smoker  . Smokeless tobacco: Never Used  Substance and Sexual Activity  . Alcohol use: Yes   Alcohol/week: 0.6 oz    Types: 1 Cans of beer per week  . Drug use: No  . Sexual activity: Not on file  Other Topics Concern  . Not on file  Social History Narrative   Divorced;  2 children, ages 29yo and 11yo, [place], exercise - aerobic activity, calisthenics.   08/2017    Family History  Problem Relation Age of Onset  . Other Father        died in [place]  . Diabetes Brother        type 1  . Cancer Paternal Grandfather        lung cancer  . Heart disease Maternal Grandfather 80  . Hyperlipidemia Neg Hx   . Hypertension Neg Hx   . Stroke Neg Hx      Current Outpatient Medications:  .  dexlansoprazole (DEXILANT) 60 MG capsule, Take 1 capsule (60 mg total) by mouth daily., Disp: 90 capsule, Rfl: 3 .  levothyroxine (SYNTHROID, LEVOTHROID) 200 MCG tablet, Take 200 mcg by mouth daily.  , Disp: , Rfl:  .  valACYclovir (VALTREX) 1000 MG tablet, TAKE 1 TABLET BY MOUTH EVERY DAY FOR SUPPRESSION, 2 TABLETS TWICE DAILY FOR 1 DAY FOR FLARE UP, Disp: 90 tablet, Rfl: 3 .  clotrimazole-betamethasone (LOTRISONE) cream, Apply 1 application topically 2 (two) times daily., Disp: 45 g, Rfl: 0  No Known Allergies  Review of Systems Constitutional: -fever, -chills, -sweats, -unexpected weight change, -decreased appetite, -fatigue Allergy: -sneezing, -itching, -congestion Dermatology: -changing moles, +rash, -lumps ENT: -runny nose, -ear pain, -sore throat, -hoarseness, -sinus pain, -teeth pain, - ringing in ears, -hearing loss, -nosebleeds Cardiology: -chest pain, -palpitations, -swelling, -difficulty breathing when lying flat, -waking up short of breath Respiratory: -cough, -shortness of breath, -difficulty breathing with exercise or exertion, -wheezing, -coughing up blood Gastroenterology: -abdominal pain, -nausea, -vomiting, -diarrhea, -constipation, +blood in stool, -changes in bowel movement, -difficulty swallowing or eating Hematology: -bleeding, -bruising  Musculoskeletal: -joint  aches, -muscle aches, -joint swelling, -back pain, -neck pain, -cramping, -changes in gait Ophthalmology: denies vision changes, eye redness, itching, discharge Urology: -burning with urination, -difficulty urinating, -blood in urine, -urinary frequency, -urgency, -incontinence Neurology: -headache, -weakness, -tingling, -numbness, -memory loss, -falls, -dizziness Psychology: -depressed mood, -agitation, -sleep problems     Objective:   Physical Exam  BP 136/80   Pulse 61   Ht 6' (1.829 m)   Wt 249 lb (112.9 kg)   SpO2 98%   BMI 33.77 kg/m   Wt Readings from Last 3 Encounters:  09/15/17 249 lb (112.9 kg)  09/28/16 220 lb 6.4 oz (100 kg)  03/08/16 241 lb (109.3 kg)   General appearance: alert, no distress, WD/WN, white male  Skin: anterior neck surgical scar, otherwise few scattered benign appearing macules, otherwise unremarkable , right anterior lower leg tattoo,  No other worrisome lesions HEENT: normocephalic, conjunctiva/corneas normal, sclerae anicteric, PERRLA, EOMi, nares patent, no discharge or erythema, pharynx normal  Oral cavity: MMM, tongue normal, teeth in good repair  Neck: supple, anterior surgical scar, no lymphadenopathy, no thyromegaly, no masses, normal ROM, no bruits  Chest: non tender, normal shape and expansion  Heart: RRR, normal S1, S2, no murmurs  Lungs: CTA bilaterally, no wheezes, rhonchi, or rales  Abdomen: +bs, soft, striae, non tender, non distended, no masses, no hepatomegaly, no splenomegaly, no bruits  Back: non tender, normal ROM, no scoliosis, striae  Musculoskeletal: upper extremities non tender, no obvious deformity, normal ROM throughout, lower extremities non tender, no obvious deformity, normal ROM throughout  Extremities: somewhat flushed appearing hands, but no edema, no cyanosis, no clubbing  Pulses: 2+ symmetric, upper and lower extremities, normal cap refill  Neurological: alert, oriented x 3, CN2-12 intact, strength normal  upper extremities and lower extremities, sensation normal throughout, DTRs 2+ throughout, no cerebellar signs, gait normal  Psychiatric: normal affect, behavior normal, pleasant  GU: normal male external genitalia, circumcised, nontender, no masses, no hernia, no lymphadenopathy  Rectal: small anterior area at anus that could be small hemorrhoidal tissue not inflamed, otherwise normal appearing, normal tone, prostate WNL, no obvious abnormality    Assessment and Plan :    Encounter Diagnoses  Name Primary?  . Routine general medical examination at a health care facility Yes  . Hypothyroidism, unspecified type   . Recurrent cold sores   . Tinea versicolor   . Screen for STD (sexually transmitted disease)   . Blood in stool   . Dermatitis   . Influenza vaccination declined   . Gastroesophageal reflux disease, esophagitis presence not specified     Physical exam - discussed healthy lifestyle, diet, exercise, preventative care, vaccinations, and addressed their concerns.    Blood in stool - labs today, stool cards x 3.   Consider GI consult  Recurrent cold sores - doing fine on Valtrex for suppression  dermatitis under nose - can use OTC remedy as discussed  Tinea versicolor - refilled lotrisone for prn use,  Discussed risks/benefits of medication  GERD- discussed risks/benefits of prolonged PPI therapy.    Patient Instructions  Recommendations:  See your eye doctor yearly for routine vision care.  See your dentist yearly for routine dental care including hygiene visits twice yearly  I refilled your medications today  For the area under the nose, you can use either Shea butter, Aquaphor, Vaseline or hydrocortisone cream OTC  For the chest rash, you can use Lotrisone for 7-14 days at a time, but not daily for weeks.  This is a strong cream for as needed use  Please return the stool cards for check for blood in stool  We will call with lab results  I encourage you to  get back to the more disciplined regimen you were using a year ago to get the weight back to a healthier range  Check your testicles monthly for lumps/bumps  Nina was seen today for annual exam.  Diagnoses and all orders for this visit:  Routine general medical examination at a health care facility -     POCT Urinalysis DIP (Proadvantage Device) -     CBC -     Comprehensive metabolic panel -     Lipid panel -     Hemoglobin A1c -     HIV antibody -     RPR -     C. trachomatis/N. gonorrhoeae RNA -     C. trachomatis/N. gonorrhoeae RNA  Hypothyroidism, unspecified type  Recurrent cold sores  Tinea versicolor  Screen for STD (sexually transmitted disease) -     HIV antibody -     RPR -     C. trachomatis/N. gonorrhoeae RNA -     C. trachomatis/N. gonorrhoeae RNA  Blood in stool -     CBC  Dermatitis  Influenza vaccination declined  Gastroesophageal reflux disease, esophagitis presence not specified  Other orders -     clotrimazole-betamethasone (LOTRISONE) cream; Apply 1 application topically 2 (two) times daily. -     dexlansoprazole (DEXILANT) 60 MG capsule; Take 1 capsule (60 mg total) by mouth daily. -     valACYclovir (VALTREX) 1000 MG tablet; TAKE 1 TABLET BY MOUTH EVERY DAY FOR SUPPRESSION, 2 TABLETS TWICE DAILY FOR 1 DAY FOR FLARE UP    Follow up pending labs

## 2017-09-15 NOTE — Patient Instructions (Addendum)
Recommendations:  See your eye doctor yearly for routine vision care.  See your dentist yearly for routine dental care including hygiene visits twice yearly  I refilled your medications today  For the area under the nose, you can use either Shea butter, Aquaphor, Vaseline or hydrocortisone cream OTC  For the chest rash, you can use Lotrisone for 7-14 days at a time, but not daily for weeks.  This is a strong cream for as needed use  Please return the stool cards for check for blood in stool  We will call with lab results  I encourage you to get back to the more disciplined regimen you were using a year ago to get the weight back to a healthier range  Check your testicles monthly for lumps/bumps

## 2017-09-16 LAB — CBC
HEMATOCRIT: 48.7 % (ref 38.5–50.0)
HEMOGLOBIN: 16.9 g/dL (ref 13.2–17.1)
MCH: 31.6 pg (ref 27.0–33.0)
MCHC: 34.7 g/dL (ref 32.0–36.0)
MCV: 91 fL (ref 80.0–100.0)
MPV: 10 fL (ref 7.5–12.5)
Platelets: 211 10*3/uL (ref 140–400)
RBC: 5.35 10*6/uL (ref 4.20–5.80)
RDW: 12.8 % (ref 11.0–15.0)
WBC: 4.9 10*3/uL (ref 3.8–10.8)

## 2017-09-16 LAB — COMPREHENSIVE METABOLIC PANEL
AG RATIO: 1.6 (calc) (ref 1.0–2.5)
ALKALINE PHOSPHATASE (APISO): 42 U/L (ref 40–115)
ALT: 29 U/L (ref 9–46)
AST: 27 U/L (ref 10–40)
Albumin: 4.4 g/dL (ref 3.6–5.1)
BILIRUBIN TOTAL: 0.7 mg/dL (ref 0.2–1.2)
BUN: 12 mg/dL (ref 7–25)
CALCIUM: 9.6 mg/dL (ref 8.6–10.3)
CHLORIDE: 102 mmol/L (ref 98–110)
CO2: 25 mmol/L (ref 20–32)
Creat: 0.85 mg/dL (ref 0.60–1.35)
GLOBULIN: 2.7 g/dL (ref 1.9–3.7)
Glucose, Bld: 90 mg/dL (ref 65–99)
Potassium: 4.2 mmol/L (ref 3.5–5.3)
Sodium: 138 mmol/L (ref 135–146)
Total Protein: 7.1 g/dL (ref 6.1–8.1)

## 2017-09-16 LAB — LIPID PANEL
Cholesterol: 200 mg/dL — ABNORMAL HIGH (ref <200)
HDL: 54 mg/dL (ref >40)
LDL Cholesterol (Calc): 131 mg/dL (calc) — ABNORMAL HIGH
Non-HDL Cholesterol (Calc): 146 mg/dL (calc) — ABNORMAL HIGH (ref <130)
TRIGLYCERIDES: 62 mg/dL (ref <150)
Total CHOL/HDL Ratio: 3.7 (calc) (ref <5.0)

## 2017-09-16 LAB — C. TRACHOMATIS/N. GONORRHOEAE RNA
C. TRACHOMATIS RNA, TMA: NOT DETECTED
N. GONORRHOEAE RNA, TMA: NOT DETECTED

## 2017-09-16 LAB — HEMOGLOBIN A1C
Hgb A1c MFr Bld: 5 % of total Hgb (ref <5.7)
Mean Plasma Glucose: 97 (calc)
eAG (mmol/L): 5.4 (calc)

## 2017-09-16 LAB — RPR: RPR Ser Ql: NONREACTIVE

## 2017-09-16 LAB — HIV ANTIBODY (ROUTINE TESTING W REFLEX): HIV 1&2 Ab, 4th Generation: NONREACTIVE

## 2018-01-27 ENCOUNTER — Other Ambulatory Visit: Payer: Self-pay | Admitting: Medical

## 2018-05-22 ENCOUNTER — Ambulatory Visit: Payer: Commercial Managed Care - PPO | Admitting: Medical

## 2018-05-22 VITALS — BP 130/80 | HR 78 | Temp 98.0°F | Resp 16 | Ht 71.0 in | Wt 251.2 lb

## 2018-05-22 DIAGNOSIS — R2230 Localized swelling, mass and lump, unspecified upper limb: Secondary | ICD-10-CM | POA: Diagnosis not present

## 2018-05-22 DIAGNOSIS — M25421 Effusion, right elbow: Secondary | ICD-10-CM | POA: Diagnosis not present

## 2018-05-22 MED ORDER — AMOXICILLIN-POT CLAVULANATE 875-125 MG PO TABS: 1.0000 | ORAL_TABLET | Freq: Two times a day (BID) | ORAL | 0 refills | Status: DC

## 2018-05-22 NOTE — Progress Notes (Signed)
Subjective: Chief Complaint  Patient presents with  . right elbow    right elbow swollen, red, X 1week    Here for right elbow issues x 2 weeks.  Approximately August 12 he bumped his right elbow, did not think much about this but then later that evening had some swelling for a day but then this resolved.  Then about a week later this past Friday it flared back up with swelling at the elbow without any new injury.  The next day the swelling progressed down the forearm so he decided to come in for evaluation.  He denies fever, nausea, vomiting, feels fine otherwise, does not have a lot of pain unless he puts direct pressure on the elbow.  He denies break in the skin, denies being on his elbows for prolonged period.  Was at the beach recently on vacation.  Denies any other trauma or injury.  Denies repetitive activity.  No prior similar.  Been using ibuprofen over-the-counter up to 800 mg twice daily for a few days. No other aggravating or relieving factors. No other complaint.   Past Medical History:  Diagnosis Date  . Astigmatism    wears glasses for reading  . GERD (gastroesophageal reflux disease)   . Hypothyroidism   . Papillary carcinoma of thyroid ([facility]) 03/2010   subsequent thyroidectomy; Dr. Jerene Canny  . Recurrent cold sores   . Wears glasses    Current Outpatient Medications on File Prior to Visit  Medication Sig Dispense Refill  . clotrimazole-betamethasone (LOTRISONE) cream Apply 1 application topically 2 (two) times daily. 45 g 0  . dexlansoprazole (DEXILANT) 60 MG capsule Take 1 capsule (60 mg total) by mouth daily. 90 capsule 3  . levothyroxine (SYNTHROID, LEVOTHROID) 200 MCG tablet Take 200 mcg by mouth daily.      . valACYclovir (VALTREX) 1000 MG tablet TAKE 1 TABLET BY MOUTH EVERY DAY FOR SUPPRESSION, 2 TABLETS TWICE DAILY FOR 1 DAY FOR FLARE UP 90 tablet 3   No current facility-administered medications on file prior to visit.    ROS as in  subjective   Objective: BP 130/80   Pulse 78   Temp 98 F (36.7 C) (Oral)   Resp 16   Ht 5\' 11"  (1.803 m)   Wt 251 lb 3.2 oz (113.9 kg)   SpO2 98%   BMI 35.04 kg/m   Gen: wd, wn, nad Skin: Slight fullness or tightness of the skin over the right elbow slightly indurated, slight warmth otherwise skin unremarkable.  No obvious break in the skin or wound MSK: There is mild to moderate general swelling of the right elbow area and forearm, mainly over the olecranon on and forearm specifically but no large swollen fluctuant area over the elbow.  Only tender over the elbow with moderate pressure.  Otherwise elbow shoulder and wrist normal range of motion, without pain, otherwise nontender, no pitting edema, arms neurovascularly intact     Assessment: Encounter Diagnoses  Name Primary?  . Elbow swelling, right Yes  . Localized swelling of forearm     Plan: I had Dr. Redmond School supervising physician also examined him.  Etiology could include inflammation versus infection versus bursitis.  Not clear.  Begin Augmentin below, elevate the arm, and use bag of frozen peas for ice 2-3 times a day, arm elevation when possible, and continue ibuprofen over-the-counter 200 mg, 4 tablets 3 times daily the next several days.  I asked him to call by Wednesday with an update, but if any worsening  swelling, redness, pain, warmth or decreased range of motion call right away.  Discussed possible complications and risk.  He is leaving for town on a flight tomorrow so he will check in with [place] if there is any concern tomorrow   Greogory was seen today for right elbow.  Diagnoses and all orders for this visit:  Elbow swelling, right  Localized swelling of forearm  Other orders -     amoxicillin-clavulanate (AUGMENTIN) 875-125 MG tablet; Take 1 tablet by mouth 2 (two) times daily.

## 2018-10-19 ENCOUNTER — Telehealth: Payer: Self-pay | Admitting: Medical

## 2018-10-19 NOTE — Telephone Encounter (Signed)
Pt called and stated that every one in his office has had the flu. 3 tested positive today. He does not have symptoms yet but he is going out of the country on Saturday and wanted to know if you would recommend something he could take as a precaution before he goes out of the country

## 2018-10-20 ENCOUNTER — Other Ambulatory Visit: Payer: Self-pay | Admitting: Medical

## 2018-10-20 MED ORDER — OSELTAMIVIR PHOSPHATE 75 MG PO CAPS: 75.0000 mg | ORAL_CAPSULE | Freq: Every day | ORAL | 0 refills | Status: DC

## 2018-10-20 NOTE — Telephone Encounter (Signed)
Pt was advised [facility] 

## 2018-10-20 NOTE — Telephone Encounter (Signed)
I sent [name] for prophylaxis.

## 2018-11-09 ENCOUNTER — Other Ambulatory Visit: Payer: Self-pay | Admitting: Medical

## 2018-11-09 NOTE — Telephone Encounter (Signed)
Is this refill request appropriate?

## 2018-11-09 NOTE — Telephone Encounter (Signed)
Please call patient to set up yearly physical.   This is a friendly reminder to have them come in for annual wellness exam/physical. Thanks Shane 

## 2018-11-10 NOTE — Telephone Encounter (Signed)
Left message for pt that he needs to come in for a cpe

## 2018-12-07 ENCOUNTER — Encounter: Payer: Commercial Managed Care - PPO | Admitting: Medical

## 2018-12-12 ENCOUNTER — Ambulatory Visit: Payer: Commercial Managed Care - PPO | Admitting: Medical

## 2018-12-12 ENCOUNTER — Encounter: Payer: Self-pay | Admitting: Medical

## 2018-12-12 ENCOUNTER — Other Ambulatory Visit: Payer: Self-pay

## 2018-12-12 VITALS — BP 130/78 | HR 60 | Temp 97.9°F | Resp 16 | Ht 71.0 in | Wt 264.6 lb

## 2018-12-12 DIAGNOSIS — R609 Edema, unspecified: Secondary | ICD-10-CM | POA: Diagnosis not present

## 2018-12-12 DIAGNOSIS — B001 Herpesviral vesicular dermatitis: Secondary | ICD-10-CM

## 2018-12-12 DIAGNOSIS — Z0000 Encounter for general adult medical examination without abnormal findings: Secondary | ICD-10-CM

## 2018-12-12 DIAGNOSIS — R079 Chest pain, unspecified: Secondary | ICD-10-CM

## 2018-12-12 DIAGNOSIS — E669 Obesity, unspecified: Secondary | ICD-10-CM

## 2018-12-12 DIAGNOSIS — E039 Hypothyroidism, unspecified: Secondary | ICD-10-CM

## 2018-12-12 DIAGNOSIS — K219 Gastro-esophageal reflux disease without esophagitis: Secondary | ICD-10-CM

## 2018-12-12 LAB — POCT URINALYSIS DIP (PROADVANTAGE DEVICE)
Bilirubin, UA: NEGATIVE
Glucose, UA: NEGATIVE mg/dL
Ketones, POC UA: NEGATIVE mg/dL
Leukocytes, UA: NEGATIVE
Nitrite, UA: NEGATIVE
PH UA: 6 (ref 5.0–8.0)
PROTEIN UA: NEGATIVE mg/dL
RBC UA: NEGATIVE
SPECIFIC GRAVITY, URINE: 1.025
UUROB: NEGATIVE

## 2018-12-12 NOTE — Progress Notes (Signed)
Subjective:   HPI  Bradley Watts is a 43 y.o. male who presents for Chief Complaint  Patient presents with  . CPE    CPE non fasting    Patient Care Team: , Leward Quan as PCP - General Sees dentist Sees eye doctor  Concerns: Gets occasional discomfort in chest, sometimes in left arm, without SOB, nausea, swears or dizziness.  Pain is usually at rest, not associated with exercise.  Sometimes gets weird beat of the heart.  No syncope.  Recent had swelling in ankles and lower legs bilat without good reason.   Sees endocrinology, Dr. Forde Dandy, and had labs recently.  Reviewed their medical, surgical, family, social, medication, and allergy history and updated chart as appropriate.  Past Medical History:  Diagnosis Date  . Astigmatism    wears glasses for reading  . GERD (gastroesophageal reflux disease)   . Hypothyroidism   . 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 Jerene Canny  . Recurrent cold sores   . Wears glasses     Past Surgical History:  Procedure Laterality Date  . CLOSED REDUCTION TIBIAL FRACTURE     left  . THYROIDECTOMY  7/11    Social History   Socioeconomic History  . Marital status: Married    Spouse name: Not on file  . Number of children: Not on file  . Years of education: Not on file  . Highest education level: Not on file  Occupational History  . Occupation: Therapist, sports: [employer]    Comment: Radiographer, therapeutic  Social Needs  . Financial resource strain: Not on file  . Food insecurity:    Worry: Not on file    Inability: Not on file  . Transportation needs:    Medical: Not on file    Non-medical: Not on file  Tobacco Use  . Smoking status: Never Smoker  . Smokeless tobacco: Never Used  Substance and Sexual Activity  . Alcohol use: Yes    Alcohol/week: 1.0 standard drinks    Types: 1 Cans of beer per week  . Drug use: No  . Sexual activity: Not on file   Lifestyle  . Physical activity:    Days per week: Not on file    Minutes per session: Not on file  . Stress: Not on file  Relationships  . Social connections:    Talks on phone: Not on file    Gets together: Not on file    Attends religious service: Not on file    Active member of club or organization: Not on file    Attends meetings of clubs or organizations: Not on file    Relationship status: Not on file  . Intimate partner violence:    Fear of current or ex partner: Not on file    Emotionally abused: Not on file    Physically abused: Not on file    Forced sexual activity: Not on file  Other Topics Concern  . Not on file  Social History Narrative   Divorced;  2 children, ages 94yo and 11yo, [place], exercise - aerobic activity, calisthenics.   08/2017    Family History  Problem Relation Age of Onset  . Other Father        died in [place]  . Diabetes Brother        type 1  . Cancer Paternal Grandfather        lung cancer  . Heart disease Maternal  Grandfather 80  . Hyperlipidemia Neg Hx   . Hypertension Neg Hx   . Stroke Neg Hx      Current Outpatient Medications:  .  clotrimazole-betamethasone (LOTRISONE) cream, Apply 1 application topically 2 (two) times daily., Disp: 45 g, Rfl: 0 .  DEXILANT 60 MG capsule, TAKE 1 CAPSULE BY MOUTH EVERY DAY, Disp: 90 capsule, Rfl: 0 .  levothyroxine (SYNTHROID, LEVOTHROID) 200 MCG tablet, Take 200 mcg by mouth daily.  , Disp: , Rfl:  .  valACYclovir (VALTREX) 1000 MG tablet, TAKE 1 TABLET BY MOUTH EVERY DAY FOR SUPPRESSION, 2 TABLETS TWICE DAILY FOR 1 DAY FOR FLARE UP, Disp: 90 tablet, Rfl: 3  No Known Allergies     Review of Systems Constitutional: -fever, -chills, -sweats, -unexpected weight change, -decreased appetite, -fatigue Allergy: -sneezing, -itching, -congestion Dermatology: -changing moles, --rash, -lumps ENT: -runny nose, -ear pain, -sore throat, -hoarseness, -sinus pain, -teeth pain, - ringing in ears,  -hearing loss, -nosebleeds Cardiology: +chest pain, +palpitations, -swelling, -difficulty breathing when lying flat, -waking up short of breath Respiratory: -cough, -shortness of breath, -difficulty breathing with exercise or exertion, -wheezing, -coughing up blood Gastroenterology: -abdominal pain, -nausea, -vomiting, -diarrhea, -constipation, -blood in stool, -changes in bowel movement, -difficulty swallowing or eating Hematology: -bleeding, -bruising  Musculoskeletal: -joint aches, -muscle aches, -joint swelling, -back pain, -neck pain, -cramping, -changes in gait Ophthalmology: denies vision changes, eye redness, itching, discharge Urology: -burning with urination, -difficulty urinating, -blood in urine, -urinary frequency, -urgency, -incontinence Neurology: -headache, -weakness, -tingling, -numbness, -memory loss, -falls, -dizziness Psychology: -depressed mood, -agitation, -sleep problems Male GU: no testicular mass, pain, no lymph nodes swollen, no swelling, no rash.     Objective:  BP 130/78   Pulse 60   Temp 97.9 F (36.6 C) (Oral)   Resp 16   Ht 5\' 11"  (1.803 m)   Wt 264 lb 9.6 oz (120 kg)   SpO2 99%   BMI 36.90 kg/m   General appearance: alert, no distress, WD/WN, Caucasian male Skin: unremarkable HEENT: normocephalic, conjunctiva/corneas normal, sclerae anicteric, PERRLA, EOMi, nares patent, no discharge or erythema, pharynx normal Oral cavity: MMM, tongue normal, teeth normal Neck: supple, no lymphadenopathy, no thyromegaly, no masses, normal ROM, no bruits Chest: non tender, normal shape and expansion Heart: RRR, normal S1, S2, no murmurs Lungs: CTA bilaterally, no wheezes, rhonchi, or rales Abdomen: +bs, soft, non tender, non distended, no masses, no hepatomegaly, no splenomegaly, no bruits Back: non tender, normal ROM, no scoliosis Musculoskeletal: upper extremities non tender, no obvious deformity, normal ROM throughout, lower extremities non tender, no obvious  deformity, normal ROM throughout Extremities: no edema, no cyanosis, no clubbing Pulses: 2+ symmetric, upper and lower extremities, normal cap refill Neurological: alert, oriented x 3, CN2-12 intact, strength normal upper extremities and lower extremities, sensation normal throughout, DTRs 2+ throughout, no cerebellar signs, gait normal Psychiatric: normal affect, behavior normal, pleasant  GU: normal male external genitalia,circumcised, nontender, no masses, no hernia, no lymphadenopathy Rectal: deferred  EKG Chest discomfort and palpitations, rate 59 bpm, PR 166 ms, QRS 96 ms, QTC 399 ms, Axis 25 degrees, sinus bradycardia, possible left atrial enlargement, nonspecific ST changes in III  Assessment and Plan :   Encounter Diagnoses  Name Primary?  . Routine general medical examination at a health care facility Yes  . Chest pain, unspecified type   . Edema, unspecified type   . Obesity with serious comorbidity, unspecified classification, unspecified obesity type   . Recurrent cold sores   . Gastroesophageal reflux disease, esophagitis  presence not specified   . Hypothyroidism, unspecified type     Physical exam - discussed and counseled on healthy lifestyle, diet, exercise, preventative care, vaccinations, sick and well care, proper use of emergency dept and after hours care, and addressed their concerns.    Health screening: See your eye doctor yearly for routine vision care. See your dentist yearly for routine dental care including hygiene visits twice yearly.  Cancer screening Advised monthly self testicular exam  Vaccinations: Advised yearly influenza vaccine He is up to date on Td vaccine   Acute issues discussed: Obesity - discussed need to work on lifestyle changes, weight loss  GERD - c/t dexilant  Chest discomfort - discussed symptoms, possible causes, less likely cardiac in origin.   Pending labs, consider other eval.   Etiology not clear.   Consider sleep study  as well.   Separate significant chronic issues discussed: Will request recent labs and notes from endocrinology who manages his thyroid disease   Xavian was seen today for cpe.  Diagnoses and all orders for this visit:  Routine general medical examination at a health care facility -     POCT Urinalysis DIP (Proadvantage Device) -     CBC -     Hemoglobin A1c -     EKG 12-Lead  Chest pain, unspecified type -     EKG 12-Lead  Edema, unspecified type  Obesity with serious comorbidity, unspecified classification, unspecified obesity type  Recurrent cold sores  Gastroesophageal reflux disease, esophagitis presence not specified  Hypothyroidism, unspecified type   Follow-up pending labs, yearly for physical

## 2018-12-12 NOTE — Patient Instructions (Signed)
Thanks for trusting [place] with your health care and for coming in for a physical today.  Below are some general recommendations I have for you:  Yearly screenings See your eye doctor yearly for routine vision care. See your dentist yearly for routine dental care including hygiene visits twice yearly. See me here yearly for a routine physical and preventative care visit   Specific Concerns today:  . Keep a journal of your chest symptoms and give me some feedback . Work on getting exercise regularly including moderate to intense exercise 150 minutes per week . Consider [facility] Diet vs Ketogenic Diet vs pescetarian diet   Please follow up yearly for a physical.   Preventative Care for Adults - Male      [place]:  A routine yearly physical is a good way to check in with your primary care provider about your health and preventive screening. It is also an opportunity to share updates about your health and any concerns you have, and receive a thorough all-over exam.   Most health insurance companies pay for at least some preventative services.  Check with your health plan for specific coverages.  WHAT PREVENTATIVE SERVICES DO MEN NEED?  Adult men should have their weight and blood pressure checked regularly.   Men age 66 and older should have their cholesterol levels checked regularly.  Beginning at age 26 and continuing to age 69, men should be screened for colorectal cancer.  Certain people may need continued testing until age 54.  Updating vaccinations is part of preventative care.  Vaccinations help protect against diseases such as the flu.  Osteoporosis is a disease in which the bones lose minerals and strength as we age. Men ages 42 and over should discuss this with their caregivers  Lab tests are generally done as part of preventative care to screen for anemia and blood disorders, to screen for problems with the kidneys and liver, to screen for bladder  problems, to check blood sugar, and to check your cholesterol level.  Preventative services generally include counseling about diet, exercise, avoiding tobacco, drugs, excessive alcohol consumption, and sexually transmitted infections.    GENERAL RECOMMENDATIONS FOR GOOD HEALTH:  Healthy diet:  Eat a variety of foods, including fruit, vegetables, animal or vegetable protein, such as meat, fish, chicken, and eggs, or beans, lentils, tofu, and grains, such as rice.  Drink plenty of water daily.  Decrease saturated fat in the diet, avoid lots of red meat, processed foods, sweets, fast foods, and fried foods.  Exercise:  Aerobic exercise helps maintain good heart health. At least 30-40 minutes of moderate-intensity exercise is recommended. For example, a brisk walk that increases your heart rate and breathing. This should be done on most days of the week.   Find a type of exercise or a variety of exercises that you enjoy so that it becomes a part of your daily life.  Examples are running, walking, swimming, water aerobics, and biking.  For motivation and support, explore group exercise such as aerobic class, spin class, Zumba, Yoga,or  martial arts, etc.    Set exercise goals for yourself, such as a certain weight goal, walk or run in a race such as a 5k walk/run.  Speak to your primary care provider about exercise goals.  Disease prevention:  If you smoke or chew tobacco, find out from your caregiver how to quit. It can literally save your life, no matter how long you have been a tobacco user. If  you do not use tobacco, never begin.   Maintain a healthy diet and normal weight. Increased weight leads to problems with blood pressure and diabetes.   The Body Mass Index or BMI is a way of measuring how much of your body is fat. Having a BMI above 27 increases the risk of heart disease, diabetes, hypertension, stroke and other problems related to obesity. Your caregiver can help determine your  BMI and based on it develop an exercise and dietary program to help you achieve or maintain this important measurement at a healthful level.  High blood pressure causes heart and blood vessel problems.  Persistent high blood pressure should be treated with medicine if weight loss and exercise do not work.   Fat and cholesterol leaves deposits in your arteries that can block them. This causes heart disease and vessel disease elsewhere in your body.  If your cholesterol is found to be high, or if you have heart disease or certain other medical conditions, then you may need to have your cholesterol monitored frequently and be treated with medication.   Ask if you should have a cardiac stress test if your history suggests this. A stress test is a test done on a treadmill that looks for heart disease. This test can find disease prior to there being a problem.  Osteoporosis is a disease in which the bones lose minerals and strength as we age. This can result in serious bone fractures. Risk of osteoporosis can be identified using a bone density scan. Men ages 41 and over should discuss this with their caregivers. Ask your caregiver whether you should be taking a calcium supplement and Vitamin D, to reduce the rate of osteoporosis.   Avoid drinking alcohol in excess (more than two drinks per day).  Avoid use of street drugs. Do not share needles with anyone. Ask for professional help if you need assistance or instructions on stopping the use of alcohol, cigarettes, and/or drugs.  Brush your teeth twice a day with fluoride toothpaste, and floss once a day. Good oral hygiene prevents tooth decay and gum disease. The problems can be painful, unattractive, and can cause other health problems. Visit your dentist for a routine oral and dental check up and preventive care every 6-12 months.   Look at your skin regularly.  Use a mirror to look at your back. Notify your caregivers of changes in moles, especially if  there are changes in shapes, colors, a size larger than a pencil eraser, an irregular border, or development of new moles.  Safety:  Use seatbelts 100% of the time, whether driving or as a passenger.  Use safety devices such as hearing protection if you work in environments with loud noise or significant background noise.  Use safety glasses when doing any work that could send debris in to the eyes.  Use a helmet if you ride a bike or motorcycle.  Use appropriate safety gear for contact sports.  Talk to your caregiver about gun safety.  Use sunscreen with a SPF (or skin protection factor) of 15 or greater.  Lighter skinned people are at a greater risk of skin cancer. Don't forget to also wear sunglasses in order to protect your eyes from too much damaging sunlight. Damaging sunlight can accelerate cataract formation.   Practice safe sex. Use condoms. Condoms are used for birth control and to help reduce the spread of sexually transmitted infections (or STIs).  Some of the STIs are gonorrhea (the clap), chlamydia, syphilis, trichomonas,  herpes, HPV (human papilloma virus) and HIV (human immunodeficiency virus) which causes AIDS. The herpes, HIV and HPV are viral illnesses that have no cure. These can result in disability, cancer and death.   Keep carbon monoxide and smoke detectors in your home functioning at all times. Change the batteries every 6 months or use a model that plugs into the wall.   Vaccinations:  Stay up to date with your tetanus shots and other required immunizations. You should have a booster for tetanus every 10 years. Be sure to get your flu shot every year, since 5%-20% of the [place] population comes down with the flu. The flu vaccine changes each year, so being vaccinated once is not enough. Get your shot in the fall, before the flu season peaks.   Other vaccines to consider:  Human Papilloma Virus or HPV causes cancer of the cervix, and other infections that can be transmitted  from person to person. There is a vaccine for HPV, and males should get immunized between the ages of 45 and 59. It requires a series of 3 shots.   Pneumococcal vaccine to protect against certain types of pneumonia.  This is normally recommended for adults age 74 or older.  However, adults younger than 44 years old with certain underlying conditions such as diabetes, heart or lung disease should also receive the vaccine.  Shingles vaccine to protect against Varicella Zoster if you are older than age 21, or younger than 44 years old with certain underlying illness.  If you have not had the Shingrix vaccine, please call your insurer to inquire about coverage for the Shingrix vaccine given in 2 doses.   Some insurers cover this vaccine after age 50, some cover this after age 47.  If your insurer covers this, then call to schedule appointment to have this vaccine here  Hepatitis A vaccine to protect against a form of infection of the liver by a virus acquired from food.  Hepatitis B vaccine to protect against a form of infection of the liver by a virus acquired from blood or body fluids, particularly if you work in health care.  If you plan to travel internationally, check with your local health department for specific vaccination recommendations.   What should I know about cancer screening? Many types of cancers can be detected early and may often be prevented. Lung Cancer  You should be screened every year for lung cancer if: ? You are a current smoker who has smoked for at least 30 years. ? You are a former smoker who has quit within the past 15 years.  Talk to your health care provider about your screening options, when you should start screening, and how often you should be screened.  Colorectal Cancer  Routine colorectal cancer screening usually begins at 44 years of age and should be repeated every 5-10 years until you are 45 years old. You may need to be screened more often if early  forms of precancerous polyps or small growths are found. Your health care provider may recommend screening at an earlier age if you have risk factors for colon cancer.  Your health care provider may recommend using home test kits to check for hidden blood in the stool.  A small camera at the end of a tube can be used to examine your colon (sigmoidoscopy or colonoscopy). This checks for the earliest forms of colorectal cancer.  Prostate and Testicular Cancer  Depending on your age and overall health, your health care provider  may do certain tests to screen for prostate and testicular cancer.  Talk to your health care provider about any symptoms or concerns you have about testicular or prostate cancer.  Skin Cancer  Check your skin from head to toe regularly.  Tell your health care provider about any new moles or changes in moles, especially if: ? There is a change in a mole's size, shape, or color. ? You have a mole that is larger than a pencil eraser.  Always use sunscreen. Apply sunscreen liberally and repeat throughout the day.  Protect yourself by wearing long sleeves, pants, a wide-brimmed hat, and sunglasses when outside.

## 2018-12-13 LAB — HEMOGLOBIN A1C
ESTIMATED AVERAGE GLUCOSE: 100 mg/dL
HEMOGLOBIN A1C: 5.1 % (ref 4.8–5.6)

## 2018-12-13 LAB — CBC
HEMATOCRIT: 43.9 % (ref 37.5–51.0)
Hemoglobin: 15.6 g/dL (ref 13.0–17.7)
MCH: 32.4 pg (ref 26.6–33.0)
MCHC: 35.5 g/dL (ref 31.5–35.7)
MCV: 91 fL (ref 79–97)
Platelets: 214 10*3/uL (ref 150–450)
RBC: 4.82 x10E6/uL (ref 4.14–5.80)
RDW: 13.2 % (ref 11.6–15.4)
WBC: 4.6 10*3/uL (ref 3.4–10.8)

## 2018-12-14 ENCOUNTER — Encounter: Payer: Self-pay | Admitting: Medical

## 2019-01-23 ENCOUNTER — Other Ambulatory Visit: Payer: Self-pay | Admitting: Medical

## 2019-01-24 NOTE — Telephone Encounter (Signed)
I believe he is still using Dexilant but not sure about valtrex.    If still using both, fill both for 90+ 3 refills.

## 2019-01-24 NOTE — Telephone Encounter (Signed)
Is this ok to refill?  

## 2019-01-25 NOTE — Telephone Encounter (Signed)
pls call patient.   See previous message with questions

## 2019-01-25 NOTE — Telephone Encounter (Signed)
Pt is uses these meds and it has already been refilled

## 2019-10-31 ENCOUNTER — Other Ambulatory Visit: Payer: Self-pay

## 2019-10-31 ENCOUNTER — Ambulatory Visit: Payer: Commercial Managed Care - PPO | Admitting: Medical

## 2019-10-31 DIAGNOSIS — R05 Cough: Secondary | ICD-10-CM | POA: Diagnosis not present

## 2019-10-31 DIAGNOSIS — R5383 Other fatigue: Secondary | ICD-10-CM | POA: Diagnosis not present

## 2019-10-31 DIAGNOSIS — R509 Fever, unspecified: Secondary | ICD-10-CM

## 2019-10-31 DIAGNOSIS — U071 COVID-19: Secondary | ICD-10-CM

## 2019-10-31 DIAGNOSIS — R059 Cough, unspecified: Secondary | ICD-10-CM

## 2019-10-31 DIAGNOSIS — R519 Headache, unspecified: Secondary | ICD-10-CM

## 2019-10-31 MED ORDER — ALBUTEROL SULFATE HFA 108 (90 BASE) MCG/ACT IN AERS: 2.0000 | INHALATION_SPRAY | Freq: Four times a day (QID) | RESPIRATORY_TRACT | 0 refills | Status: DC | PRN

## 2019-10-31 MED ORDER — HYDROCODONE-ACETAMINOPHEN 5-325 MG PO TABS: 1.0000 | ORAL_TABLET | Freq: Four times a day (QID) | ORAL | 0 refills | Status: DC | PRN

## 2019-10-31 NOTE — Progress Notes (Signed)
Subjective:     Patient ID: Bradley Watts, male   DOB: [birth year], 44 y.o.   MRN: [id number]  This visit type was conducted due to national recommendations for restrictions regarding the COVID-19 Pandemic (e.g. social distancing) in an effort to limit this patient's exposure and mitigate transmission in our community.  Due to their co-morbid illnesses, this patient is at least at moderate risk for complications without adequate follow up.  This format is felt to be most appropriate for this patient at this time.    Documentation for virtual audio and video telecommunications through Zoom encounter:  The patient was located at home. The provider was located in the office. The patient did consent to this visit and is aware of possible charges through their insurance for this visit.  The other persons participating in this telemedicine service were none. Time spent on call was 20 minutes and in review of previous records 20 minutes total.  This virtual service is not related to other E/M service within previous 7 days.   HPI Chief Complaint  Patient presents with  . Follow-up    +covid 1 week ago   Last Wednesday 10/24/19 started running fever.  Got tested and came back + covid. Went to [facility] urgent [facility], was prescribed prednisone, Vit C, zinc, and zpak.  He felt depressed on the prednisone, and has finished the prednisone.   Symptoms have included fever, headache, chills, very tired, some cough, but doesn't feel crackling lungs.   Has felt mild SOB.   Has had body aches, mild sore throat with headache.  Mild nausea.   No vomiting, no diarrhea.  Had loss of smell and taste. Knot as base of skull, headache, wont resolve with tylenol.  Was getting better over the next few days.   Since this past weekend been running 101 fever at night.  Still concerned about night time fever, un resolving headaches, slight SOB, fatigue.  Is drinking some liquids, gives himself 5/10 on being good about fluid  intake.  No other aggravating or relieving factors. No other complaint.  Past Medical History:  Diagnosis Date  . Astigmatism    wears glasses for reading  . GERD (gastroesophageal reflux disease)   . Hypothyroidism   . 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 Jerene Canny  . Recurrent cold sores   . Wears glasses    Current Outpatient Medications on File Prior to Visit  Medication Sig Dispense Refill  . DEXILANT 60 MG capsule TAKE 1 CAPSULE BY MOUTH EVERY DAY 90 capsule 3  . levothyroxine (SYNTHROID, LEVOTHROID) 200 MCG tablet Take 200 mcg by mouth daily.      . valACYclovir (VALTREX) 1000 MG tablet TAKE 1 TABLET BY MOUTH EVERY DAY FOR SUPPRESSION, 2 TABLETS TWICE DAILY FOR 1 DAY FOR FLARE UP 90 tablet 3  . clotrimazole-betamethasone (LOTRISONE) cream Apply 1 application topically 2 (two) times daily. (Patient not taking: Reported on 10/31/2019) 45 g 0   No current facility-administered medications on file prior to visit.     Review of Systems As in subjective    Objective:   Physical Exam Due to coronavirus pandemic stay at home measures, patient visit was virtual and they were not examined in person.        Assessment:     Encounter Diagnoses  Name Primary?  . COVID-19 Yes  . Cough   . Fever, unspecified fever cause   . Fatigue, unspecified type   . Nonintractable headache, unspecified  chronicity pattern, unspecified headache type         Plan:     We discussed his recent Covid infection.  Currently still seems to have mild symptoms.  We discussed that fatigue can linger for weeks, cough can linger.  We discussed gradual return activity as tolerated.  Continue hydration, rest, and the vitamins he is taking.  He will begin albuterol for mild shortness of breath, hydrocodone for headache pain, pain in general and cough.  We discussed short-term use, proper use of medication and risk of medication.  He already finished zpak and  prednisone prescribed by urgent care.  Advise he call back or recheck within the next 48 hours if not improving.  Otherwise symptoms should gradually improve  Discussed quarantine measures, time frame to quarantine.   Discussed possible f/u with respiratory clinic in person if worse in the next 48-72 hours.   Bradley Watts was seen today for follow-up.  Diagnoses and all orders for this visit:  COVID-19  Cough  Fever, unspecified fever cause  Fatigue, unspecified type  Nonintractable headache, unspecified chronicity pattern, unspecified headache type  Other orders -     albuterol (VENTOLIN HFA) 108 (90 Base) MCG/ACT inhaler; Inhale 2 puffs into the lungs every 6 (six) hours as needed for wheezing or shortness of breath. -     HYDROcodone-acetaminophen (NORCO) 5-325 MG tablet; Take 1 tablet by mouth every 6 (six) hours as needed.  f/u prn

## 2019-11-23 ENCOUNTER — Other Ambulatory Visit: Payer: Self-pay | Admitting: Medical

## 2019-11-23 NOTE — Telephone Encounter (Signed)
[facility] [place]

## 2020-01-05 ENCOUNTER — Other Ambulatory Visit: Payer: Self-pay | Admitting: Medical

## 2020-01-08 ENCOUNTER — Telehealth: Payer: Self-pay | Admitting: Medical

## 2020-01-08 NOTE — Telephone Encounter (Signed)
Left pt a vm to call the office and schedule CPE

## 2020-01-08 NOTE — Telephone Encounter (Signed)
Please schedule for yearly physical

## 2020-04-04 ENCOUNTER — Other Ambulatory Visit: Payer: Self-pay | Admitting: Medical

## 2020-04-04 NOTE — Telephone Encounter (Signed)
Sent patient a message on mychart to call and schedule an appointment for additional refills on his medications.

## 2020-04-08 ENCOUNTER — Telehealth: Payer: Self-pay | Admitting: Medical

## 2020-04-08 NOTE — Telephone Encounter (Signed)
I received notice from pharmacy that he is getting multiple albuterol refills suggesting uncontrolled asthma.  Per my records I do not see refills.  Nevertheless if he is having problems with asthma or having to use the inhaler all the time that he needs to come in for follow-up to discuss treatment recommendations

## 2020-04-09 NOTE — Telephone Encounter (Signed)
Tried calling pt but unable to leave Vm

## 2020-04-27 ENCOUNTER — Other Ambulatory Visit: Payer: Self-pay | Admitting: Medical

## 2020-04-28 NOTE — Telephone Encounter (Signed)
Pt has an appt sept 16th

## 2020-06-12 ENCOUNTER — Other Ambulatory Visit: Payer: Self-pay

## 2020-06-12 ENCOUNTER — Ambulatory Visit (INDEPENDENT_AMBULATORY_CARE_PROVIDER_SITE_OTHER): Payer: Commercial Managed Care - PPO | Admitting: Medical

## 2020-06-12 ENCOUNTER — Encounter: Payer: Self-pay | Admitting: Medical

## 2020-06-12 VITALS — BP 130/84 | HR 69 | Ht 72.0 in | Wt 267.4 lb

## 2020-06-12 DIAGNOSIS — Z1159 Encounter for screening for other viral diseases: Secondary | ICD-10-CM

## 2020-06-12 DIAGNOSIS — R0600 Dyspnea, unspecified: Secondary | ICD-10-CM | POA: Insufficient documentation

## 2020-06-12 DIAGNOSIS — Z129 Encounter for screening for malignant neoplasm, site unspecified: Secondary | ICD-10-CM

## 2020-06-12 DIAGNOSIS — Z0000 Encounter for general adult medical examination without abnormal findings: Secondary | ICD-10-CM | POA: Diagnosis not present

## 2020-06-12 DIAGNOSIS — E890 Postprocedural hypothyroidism: Secondary | ICD-10-CM

## 2020-06-12 DIAGNOSIS — B001 Herpesviral vesicular dermatitis: Secondary | ICD-10-CM | POA: Diagnosis not present

## 2020-06-12 DIAGNOSIS — Z13220 Encounter for screening for lipoid disorders: Secondary | ICD-10-CM

## 2020-06-12 DIAGNOSIS — E039 Hypothyroidism, unspecified: Secondary | ICD-10-CM

## 2020-06-12 DIAGNOSIS — Z9089 Acquired absence of other organs: Secondary | ICD-10-CM

## 2020-06-12 DIAGNOSIS — Z7185 Encounter for immunization safety counseling: Secondary | ICD-10-CM | POA: Insufficient documentation

## 2020-06-12 DIAGNOSIS — K219 Gastro-esophageal reflux disease without esophagitis: Secondary | ICD-10-CM

## 2020-06-12 DIAGNOSIS — E66812 Morbid (severe) obesity due to excess calories: Secondary | ICD-10-CM

## 2020-06-12 DIAGNOSIS — Z85850 Personal history of malignant neoplasm of thyroid: Secondary | ICD-10-CM

## 2020-06-12 DIAGNOSIS — Z9889 Other specified postprocedural states: Secondary | ICD-10-CM | POA: Insufficient documentation

## 2020-06-12 DIAGNOSIS — Z6836 Body mass index (BMI) 36.0-36.9, adult: Secondary | ICD-10-CM

## 2020-06-12 DIAGNOSIS — Z113 Encounter for screening for infections with a predominantly sexual mode of transmission: Secondary | ICD-10-CM

## 2020-06-12 DIAGNOSIS — Z8616 Personal history of COVID-19: Secondary | ICD-10-CM

## 2020-06-12 DIAGNOSIS — Z7189 Other specified counseling: Secondary | ICD-10-CM

## 2020-06-12 DIAGNOSIS — Z131 Encounter for screening for diabetes mellitus: Secondary | ICD-10-CM | POA: Insufficient documentation

## 2020-06-12 DIAGNOSIS — Z139 Encounter for screening, unspecified: Secondary | ICD-10-CM

## 2020-06-12 MED ORDER — DEXILANT 60 MG PO CPDR: 60.0000 mg | DELAYED_RELEASE_CAPSULE | Freq: Every day | ORAL | 3 refills | Status: DC

## 2020-06-12 MED ORDER — VALACYCLOVIR HCL 1 G PO TABS: ORAL_TABLET | ORAL | 3 refills | Status: DC

## 2020-06-12 NOTE — Progress Notes (Signed)
Subjective:   HPI  Bradley Watts is a 44 y.o. male who presents for Chief Complaint  Patient presents with  . Annual Exam    with fasting labs-std screening     Patient Care Team: Landan Fedie, Leward Quan as PCP - General Sees dentist Sees eye doctor Dr. Carrolyn Meiers, endocrinology  Concerns: Been doing well  Daughter diagnosed with type 1 diabetes a year ago.  Son made middle school soccer team, as tall as dad is now.  Daughter is 2yo and son 40yo now.  Had Covid back in February 2021.  Since then he is having some changes in his breathing as if he cannot get a full breath.  He is still using albuterol inhaler somewhat regularly.  No history of asthma.  No smoker no history of lung disease.  He also wonders if the types of mask he uses get some some difficulty so he has tried different facial mask  History of thyroid cancer.  Still sees Dr. Forde Dandy regularly  He has a possible acne lump versus cold sore of his right upper lip he wants me to look at   He notes a bump of his left thumb and a bump of his left middle toe he wants me look at.  These are relatively new.  Not painful and no pain or difficulty with finger to range of motion  Reviewed their medical, surgical, family, social, medication, and allergy history and updated chart as appropriate.  Past Medical History:  Diagnosis Date  . Astigmatism    wears glasses for reading  . GERD (gastroesophageal reflux disease)   . Hypothyroidism   . 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 Jerene Canny  . Recurrent cold sores   . Wears glasses     Past Surgical History:  Procedure Laterality Date  . CLOSED REDUCTION TIBIAL FRACTURE     left  . THYROIDECTOMY  7/11    Family History  Problem Relation Age of Onset  . Other Father        died in [place]  . Diabetes Brother        type 1  . Cancer Paternal Grandfather        lung cancer  . Heart disease Maternal Grandfather 80  .  Hyperlipidemia Neg Hx   . Hypertension Neg Hx   . Stroke Neg Hx      Current Outpatient Medications:  .  albuterol (VENTOLIN HFA) 108 (90 Base) MCG/ACT inhaler, INHALE 2 PUFFS BY MOUTH EVERY 6 HOURS AS NEEDED FOR WHEEZE OR SHORTNESS OF BREATH, Disp: 8 g, Rfl: 1 .  dexlansoprazole (DEXILANT) 60 MG capsule, Take 1 capsule (60 mg total) by mouth daily., Disp: 90 capsule, Rfl: 3 .  levothyroxine (SYNTHROID, LEVOTHROID) 200 MCG tablet, Take 200 mcg by mouth daily.  , Disp: , Rfl:  .  valACYclovir (VALTREX) 1000 MG tablet, TAKE 1 TABLET BY MOUTH EVERY DAY FOR SUPPRESSION, 2 TABLETS TWICE DAILY FOR 1 DAY FOR FLARE UP, Disp: 90 tablet, Rfl: 3  No Known Allergies     Review of Systems Constitutional: -fever, -chills, -sweats, -unexpected weight change, -decreased appetite, -fatigue Allergy: -sneezing, -itching, -congestion Dermatology: -changing moles, --rash, +lumps ENT: -runny nose, -ear pain, -sore throat, -hoarseness, -sinus pain, -teeth pain, - ringing in ears, -hearing loss, -nosebleeds Cardiology: -chest pain, -palpitations, -swelling, -difficulty breathing when lying flat, -waking up short of breath Respiratory: -cough, -shortness of breath, -difficulty breathing with exercise or exertion, -wheezing, -coughing up blood  Gastroenterology: -abdominal pain, -nausea, -vomiting, -diarrhea, -constipation, -blood in stool, -changes in bowel movement, -difficulty swallowing or eating Hematology: -bleeding, -bruising  Musculoskeletal: -joint aches, -muscle aches, -joint swelling, -back pain, -neck pain, -cramping, -changes in gait Ophthalmology: denies vision changes, eye redness, itching, discharge Urology: -burning with urination, -difficulty urinating, -blood in urine, -urinary frequency, -urgency, -incontinence Neurology: -headache, -weakness, -tingling, -numbness, -memory loss, -falls, -dizziness Psychology: -depressed mood, -agitation, -sleep problems Male GU: no testicular mass, pain, no  lymph nodes swollen, no swelling, no rash.     Objective:  BP 130/84   Pulse 69   Ht 6' (1.829 m)   Wt 267 lb 6.4 oz (121.3 kg)   SpO2 99%   BMI 36.27 kg/m   General appearance: alert, no distress, WD/WN, Caucasian male Skin: Dorsal left middle toe distal phalanx just proximal to the toenail with a round raised uniform pinkish 4 mm diameter soft tissue lesion suggestive of granuloma.  Left thumb distal phalanx dorsal at the joint line laterally with a round raised nodular appearance.  It is difficult to ascertain whether this is a soft tissue nodule such as a cyst versus a bony arthritic nodule.  Either way it looks benign and not affecting range of motion.  No locking of the thumb.  Right upper lip with round small 2 mm lesion at the vermilion border, appears to be an rib such as possible cold sore HEENT: normocephalic, conjunctiva/corneas normal, sclerae anicteric, PERRLA, EOMi, nares patent, no discharge or erythema, pharynx normal Oral cavity: MMM, tongue normal, teeth normal Neck: supple, no lymphadenopathy, no thyromegaly, no masses, normal ROM, no bruits Chest: non tender, normal shape and expansion Heart: RRR, normal S1, S2, no murmurs Lungs: CTA bilaterally, no wheezes, rhonchi, or rales Abdomen: +bs, soft, non tender, non distended, no masses, no hepatomegaly, no splenomegaly, no bruits Back: non tender, normal ROM, no scoliosis Musculoskeletal: upper extremities non tender, no obvious deformity, normal ROM throughout, lower extremities non tender, no obvious deformity, normal ROM throughout Extremities: no edema, no cyanosis, no clubbing Pulses: 2+ symmetric, upper and lower extremities, normal cap refill Neurological: alert, oriented x 3, CN2-12 intact, strength normal upper extremities and lower extremities, sensation normal throughout, DTRs 2+ throughout, no cerebellar signs, gait normal Psychiatric: normal affect, behavior normal, pleasant  GU: normal male external  genitalia,circumcised, nontender, no masses, no hernia, no lymphadenopathy Rectal:deferred     Assessment and Plan :   Encounter Diagnoses  Name Primary?  . Routine general medical examination at a health care facility Yes  . Class 2 severe obesity with serious comorbidity and body mass index (BMI) of 36.0 to 36.9 in adult, unspecified obesity type ([place])   . Recurrent cold sores   . Gastroesophageal reflux disease, unspecified whether esophagitis present   . Hypothyroidism, unspecified type   . Vaccine counseling   . Screening for cancer   . History of thyroid cancer   . S/P thyroidectomy   . Screening for lipid disorders   . Screening for diabetes mellitus   . Screen for STD (sexually transmitted disease)   . Screening for condition   . Encounter for hepatitis C screening test for low risk patient   . Dyspnea, unspecified type   . History of COVID-19     Physical exam - discussed and counseled on healthy lifestyle, diet, exercise, preventative care, vaccinations, sick and well care, proper use of emergency dept and after hours care, and addressed their concerns.    Health screening: See your eye doctor yearly  for routine vision care. See your dentist yearly for routine dental care including hygiene visits twice yearly.  Discussed STD testing, discussed prevention, condom use, means of transmission  Cancer screening Advised monthly self testicular exam  Colonoscopy:  Check insurance for coverage  Discussed PSA, prostate exam, and prostate cancer screening risks/benefits.   Plan age 87yo   Vaccinations: Advised yearly influenza vaccine He declines flu and covid shots Up to date on Td   Separate significant issues discussed: Dyspnea since Covid infection in [place] will refer for chest x-ray and PFT.  Continue albuterol inhaler as needed.  Begin sample of  Skin lesion of finger, left thumb - bony arthritis change vs maybe tiny ganglion cyst, unclear, but no  worrisome findings  Skin lesion of left middle toe - likely benign granuloma  Recurrent cold sores-continue Valtrex daily for prevention and discussed acute therapy for flareup  History of thyroid cancer, hypothyroidism, managed by endocrinology  GERD-continue Dexilant long-term.  He will check with insurance about colonoscopy.  We would like to get an endoscopically as well along with this referral  Obesity-continue to work on lifestyle measures to lose weight   Stylianos was seen today for annual exam.  Diagnoses and all orders for this visit:  Routine general medical examination at a health care facility -     Comprehensive metabolic panel -     CBC with Differential/Platelet -     Lipid panel -     Hemoglobin A1c -     HIV Antibody (routine testing w rflx) -     RPR -     GC/Chlamydia Probe Amp -     Hepatitis C antibody -     Hepatitis B surface antibody,quantitative -     Hepatitis B surface antigen -     Pulmonary Function Test; Future  Class 2 severe obesity with serious comorbidity and body mass index (BMI) of 36.0 to 36.9 in adult, unspecified obesity type ([facility])  Recurrent cold sores  Gastroesophageal reflux disease, unspecified whether esophagitis present  Hypothyroidism, unspecified type  Vaccine counseling  Screening for cancer  History of thyroid cancer  S/P thyroidectomy  Screening for lipid disorders  Screening for diabetes mellitus  Screen for STD (sexually transmitted disease) -     HIV Antibody (routine testing w rflx) -     RPR -     GC/Chlamydia Probe Amp -     Hepatitis C antibody -     Hepatitis B surface antibody,quantitative -     Hepatitis B surface antigen  Screening for condition -     Hepatitis B surface antibody,quantitative  Encounter for hepatitis C screening test for low risk patient -     Hepatitis C antibody  Dyspnea, unspecified type -     Pulmonary Function Test; Future -     DG Chest 2 View; Future  History of  COVID-19 -     Pulmonary Function Test; Future -     DG Chest 2 View; Future  Other orders -     valACYclovir (VALTREX) 1000 MG tablet; TAKE 1 TABLET BY MOUTH EVERY DAY FOR SUPPRESSION, 2 TABLETS TWICE DAILY FOR 1 DAY FOR FLARE UP -     dexlansoprazole (DEXILANT) 60 MG capsule; Take 1 capsule (60 mg total) by mouth daily.    Follow-up pending labs, yearly for physical

## 2020-06-12 NOTE — Patient Instructions (Signed)
It was a pleasure to see you today   We will scheule you for a spirometry breathing test   Please go to [facility] for your chest xray.   Their hours are 8am - 4:30 pm Monday - Friday.  Take your insurance card with you.  [facility] [phone]  [address], [place], [place] [address]  [address], [place] [address]   We will call with lab results   See your dentist and eye doctor yearly  Continue routine follow-up with endocrinology    Specific issues Continue your Valtrex for cold sores  Begin the daily preventative inhaler samples and let us see how this helps.  You can continue albuterol rescue inhaler as needed

## 2020-06-13 ENCOUNTER — Other Ambulatory Visit: Payer: Self-pay

## 2020-06-13 ENCOUNTER — Other Ambulatory Visit: Payer: Self-pay | Admitting: Medical

## 2020-06-13 DIAGNOSIS — R0602 Shortness of breath: Secondary | ICD-10-CM

## 2020-06-13 LAB — CBC WITH DIFFERENTIAL/PLATELET
Basophils Absolute: 0 10*3/uL (ref 0.0–0.2)
Basos: 1 %
EOS (ABSOLUTE): 0.1 10*3/uL (ref 0.0–0.4)
Eos: 2 %
Hematocrit: 47.2 % (ref 37.5–51.0)
Hemoglobin: 16.4 g/dL (ref 13.0–17.7)
Immature Grans (Abs): 0 10*3/uL (ref 0.0–0.1)
Immature Granulocytes: 0 %
Lymphocytes Absolute: 1.7 10*3/uL (ref 0.7–3.1)
Lymphs: 33 %
MCH: 32.2 pg (ref 26.6–33.0)
MCHC: 34.7 g/dL (ref 31.5–35.7)
MCV: 93 fL (ref 79–97)
Monocytes Absolute: 0.4 10*3/uL (ref 0.1–0.9)
Monocytes: 8 %
Neutrophils Absolute: 2.9 10*3/uL (ref 1.4–7.0)
Neutrophils: 56 %
Platelets: 208 10*3/uL (ref 150–450)
RBC: 5.09 x10E6/uL (ref 4.14–5.80)
RDW: 12.3 % (ref 11.6–15.4)
WBC: 5.1 10*3/uL (ref 3.4–10.8)

## 2020-06-13 LAB — HEMOGLOBIN A1C
Est. average glucose Bld gHb Est-mCnc: 111 mg/dL
Hgb A1c MFr Bld: 5.5 % (ref 4.8–5.6)

## 2020-06-13 LAB — COMPREHENSIVE METABOLIC PANEL
ALT: 25 IU/L (ref 0–44)
AST: 20 IU/L (ref 0–40)
Albumin/Globulin Ratio: 2.3 — ABNORMAL HIGH (ref 1.2–2.2)
Albumin: 4.9 g/dL (ref 4.0–5.0)
Alkaline Phosphatase: 52 IU/L (ref 44–121)
BUN/Creatinine Ratio: 27 — ABNORMAL HIGH (ref 9–20)
BUN: 24 mg/dL (ref 6–24)
Bilirubin Total: 0.3 mg/dL (ref 0.0–1.2)
CO2: 24 mmol/L (ref 20–29)
Calcium: 9.7 mg/dL (ref 8.7–10.2)
Chloride: 103 mmol/L (ref 96–106)
Creatinine, Ser: 0.88 mg/dL (ref 0.76–1.27)
GFR calc Af Amer: 120 mL/min/{1.73_m2} (ref >59)
GFR calc non Af Amer: 104 mL/min/{1.73_m2} (ref >59)
Globulin, Total: 2.1 g/dL (ref 1.5–4.5)
Glucose: 94 mg/dL (ref 65–99)
Potassium: 4.5 mmol/L (ref 3.5–5.2)
Sodium: 142 mmol/L (ref 134–144)
Total Protein: 7 g/dL (ref 6.0–8.5)

## 2020-06-13 LAB — LIPID PANEL
Chol/HDL Ratio: 5.6 ratio — ABNORMAL HIGH (ref 0.0–5.0)
Cholesterol, Total: 241 mg/dL — ABNORMAL HIGH (ref 100–199)
HDL: 43 mg/dL (ref >39)
LDL Chol Calc (NIH): 173 mg/dL — ABNORMAL HIGH (ref 0–99)
Triglycerides: 139 mg/dL (ref 0–149)
VLDL Cholesterol Cal: 25 mg/dL (ref 5–40)

## 2020-06-13 LAB — HEPATITIS B SURFACE ANTIGEN: Hepatitis B Surface Ag: NEGATIVE

## 2020-06-13 LAB — RPR: RPR Ser Ql: NONREACTIVE

## 2020-06-13 LAB — HIV ANTIBODY (ROUTINE TESTING W REFLEX): HIV Screen 4th Generation wRfx: NONREACTIVE

## 2020-06-13 LAB — HEPATITIS B SURFACE ANTIBODY, QUANTITATIVE: Hepatitis B Surf Ab Quant: <3.1 m[IU]/mL — ABNORMAL LOW (ref >9.9)

## 2020-06-13 LAB — HEPATITIS C ANTIBODY: Hep C Virus Ab: <0.1 s/co ratio (ref 0.0–0.9)

## 2020-06-13 MED ORDER — ALBUTEROL SULFATE HFA 108 (90 BASE) MCG/ACT IN AERS: INHALATION_SPRAY | RESPIRATORY_TRACT | 0 refills | Status: DC

## 2020-06-14 LAB — GC/CHLAMYDIA PROBE AMP
Chlamydia trachomatis, NAA: NEGATIVE
Neisseria Gonorrhoeae by PCR: NEGATIVE

## 2020-07-06 ENCOUNTER — Other Ambulatory Visit: Payer: Self-pay | Admitting: Medical

## 2020-09-06 ENCOUNTER — Other Ambulatory Visit: Payer: Self-pay | Admitting: Medical

## 2020-09-08 NOTE — Telephone Encounter (Signed)
[facility]. Please advise [facility] 

## 2020-10-09 ENCOUNTER — Other Ambulatory Visit: Payer: Self-pay | Admitting: Medical

## 2020-12-10 ENCOUNTER — Other Ambulatory Visit: Payer: Self-pay

## 2020-12-10 ENCOUNTER — Ambulatory Visit: Payer: Commercial Managed Care - PPO | Admitting: Medical

## 2020-12-10 ENCOUNTER — Encounter: Payer: Self-pay | Admitting: Medical

## 2020-12-10 VITALS — BP 124/80 | HR 64 | Temp 98.3°F | Wt 271.8 lb

## 2020-12-10 DIAGNOSIS — R195 Other fecal abnormalities: Secondary | ICD-10-CM | POA: Diagnosis not present

## 2020-12-10 DIAGNOSIS — R140 Abdominal distension (gaseous): Secondary | ICD-10-CM | POA: Insufficient documentation

## 2020-12-10 DIAGNOSIS — J0110 Acute frontal sinusitis, unspecified: Secondary | ICD-10-CM | POA: Diagnosis not present

## 2020-12-10 DIAGNOSIS — R109 Unspecified abdominal pain: Secondary | ICD-10-CM

## 2020-12-10 DIAGNOSIS — G8929 Other chronic pain: Secondary | ICD-10-CM | POA: Insufficient documentation

## 2020-12-10 MED ORDER — SULFAMETHOXAZOLE-TRIMETHOPRIM 800-160 MG PO TABS: 1.0000 | ORAL_TABLET | Freq: Two times a day (BID) | ORAL | 0 refills | Status: DC

## 2020-12-10 MED ORDER — ALBENDAZOLE 200 MG PO TABS: ORAL_TABLET | ORAL | 0 refills | Status: DC

## 2020-12-10 NOTE — Progress Notes (Signed)
Subjective:  Bradley Watts is a 45 y.o. male who presents for Chief Complaint  Patient presents with  . white stuff in stool    White stuff in stool last 4 days. Been having stomach aches, fatigue and allergies have been flaring up so not sure if that's related or not   Here for stool and gastric issues.  4-5 days ago had weird bowel movement.   At the time thought he saw little egg sacks or sausage casing in the stool.   The day before ate some sausage. Even today having similar sacs in stool, feels some stomach ache.   Always has had stomach issue on and off chronically.  Certain foods given him problems, sometimes worse than other times.Marland Kitchen even when eating healthy experiencing bowel discomfort.  Been using probiotic.    No fever, no body aches or chills. Few months ago had some weird bowels loose but no recent diarrhea.  Recently stools have been somewhat formed to looser.    Has had a lot of gas and bloating, chronic problems with gastric issues.    2 weeks ago started with allergies, nasal drip, nose running, turning in to thick green mucous, head and ear pressure.   Wants something to clear this up.    No other aggravating or relieving factors.    No other c/o.  Past Medical History:  Diagnosis Date  . Astigmatism    wears glasses for reading  . GERD (gastroesophageal reflux disease)   . Hypothyroidism   . 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 Jerene Canny  . Recurrent cold sores   . Wears glasses    Current Outpatient Medications on File Prior to Visit  Medication Sig Dispense Refill  . albuterol (VENTOLIN HFA) 108 (90 Base) MCG/ACT inhaler INHALE 2 PUFFS BY MOUTH EVERY 6 HOURS AS NEEDED FOR WHEEZE OR SHORTNESS OF BREATH 6.7 each 1  . dexlansoprazole (DEXILANT) 60 MG capsule Take 1 capsule (60 mg total) by mouth daily. 90 capsule 3  . levothyroxine (SYNTHROID, LEVOTHROID) 200 MCG tablet Take 200 mcg by mouth daily.    . valACYclovir  (VALTREX) 1000 MG tablet TAKE 1 TABLET BY MOUTH EVERY DAY FOR SUPPRESSION, 2 TABLETS TWICE DAILY FOR 1 DAY FOR FLARE UP 270 tablet 1   No current facility-administered medications on file prior to visit.     The following portions of the patient's history were reviewed and updated as appropriate: allergies, current medications, past family history, past medical history, past social history, past surgical history and problem list.  ROS Otherwise as in subjective above  Objective: BP 124/80   Pulse 64   Temp 98.3 F (36.8 C)   Wt 271 lb 12.8 oz (123.3 kg)   BMI 36.86 kg/m   Wt Readings from Last 3 Encounters:  12/10/20 271 lb 12.8 oz (123.3 kg)  06/12/20 267 lb 6.4 oz (121.3 kg)  12/12/18 264 lb 9.6 oz (120 kg)    General appearance: alert, no distress, well developed, well nourished HEENT: normocephalic, sclerae anicteric, conjunctiva pink and moist, TMs with mucous effusion behind TMs, nares patent, no discharge or erythema, pharynx normal Oral cavity: MMM, no lesions Neck: supple, no lymphadenopathy, no thyromegaly, no masses Heart: RRR, normal S1, S2, no murmurs Lungs: CTA bilaterally, no wheezes, rhonchi, or rales Abdomen: +bs, soft, non tender, non distended, no masses, no hepatomegaly, no splenomegaly Pulses: 2+ radial pulses, 2+ pedal pulses, normal cap refill Ext: no edema   Assessment: Encounter  Diagnoses  Name Primary?  . Abnormal findings in stool Yes  . Chronic abdominal pain   . Bloating   . Acute non-recurrent frontal sinusitis      Plan: Discussed his pictures he sent and symptoms.  Gave option of stool O&P vs trial of antiparasitic medicaiton.   We will move forward with trial of Albendazole.  C/t probiotic, avoid spicy or greasy foods, hydrate well.  We will refer to GI for chronic abdomina and digestive issues, screen for colon cancer  Begin Bactrim to help with both gastric and sinusitis issues.  Can c/t allergy medication OTC, good hydration.     [name] was seen today for white stuff in stool.  Diagnoses and all orders for this visit:  Abnormal findings in stool -     Ambulatory referral to Gastroenterology  Chronic abdominal pain  Bloating  Acute non-recurrent frontal sinusitis  Other orders -     albendazole (ALBENZA) 200 MG tablet; 400mg  daily x 3 days, may repeat 1 dose in 2 weeks -     sulfamethoxazole-trimethoprim (BACTRIM DS) 800-160 MG tablet; Take 1 tablet by mouth 2 (two) times daily.    Follow up: pending GI consult

## 2020-12-11 ENCOUNTER — Other Ambulatory Visit: Payer: Self-pay | Admitting: Medical

## 2020-12-11 MED ORDER — ALBENDAZOLE 200 MG PO TABS: ORAL_TABLET | ORAL | 0 refills | Status: DC

## 2020-12-11 NOTE — Addendum Note (Signed)
Addended by: Carlena Hurl on: 12/11/2020 05:59 PM   Modules accepted: Orders

## 2020-12-12 NOTE — Telephone Encounter (Signed)
[facility] Looks like you have sent in enough to last pt. Please advise if refusing is ok. [place]

## 2021-03-06 ENCOUNTER — Other Ambulatory Visit: Payer: Self-pay | Admitting: Medical

## 2021-03-16 ENCOUNTER — Other Ambulatory Visit: Payer: Self-pay | Admitting: Medical

## 2021-03-20 NOTE — Telephone Encounter (Signed)
Called pharmacy & Protonix went thru ins for $2.75 and patient picked up

## 2021-04-06 ENCOUNTER — Other Ambulatory Visit: Payer: Self-pay | Admitting: Medical

## 2021-04-06 NOTE — Telephone Encounter (Signed)
Bradley Watts, please advise. Doesn't look like laura has started this yet. Please advise if you want to refill protonix

## 2021-04-07 ENCOUNTER — Other Ambulatory Visit: Payer: Self-pay | Admitting: Medical

## 2021-04-07 MED ORDER — PANTOPRAZOLE SODIUM 20 MG PO TBEC: 20.0000 mg | DELAYED_RELEASE_TABLET | Freq: Two times a day (BID) | ORAL | 0 refills | Status: DC

## 2021-04-07 NOTE — Telephone Encounter (Signed)
Sent pt a message and sent to laura to handle prior auth if we can not get to it this week

## 2021-04-08 ENCOUNTER — Other Ambulatory Visit: Payer: Self-pay | Admitting: Medical

## 2021-04-08 MED ORDER — DEXLANSOPRAZOLE 60 MG PO CPDR: 60.0000 mg | DELAYED_RELEASE_CAPSULE | Freq: Every day | ORAL | 3 refills | Status: DC

## 2021-04-08 NOTE — Telephone Encounter (Addendum)
Insurance has approved Dexilant 60mg  capsule, once a day. Please send in medication.  Approved Case ID- [id number] valid 03/09/2021-04/08/2022

## 2021-04-08 NOTE — Progress Notes (Unsigned)
nexium 

## 2021-04-09 ENCOUNTER — Other Ambulatory Visit: Payer: Self-pay | Admitting: Internal Medicine

## 2021-04-09 MED ORDER — DEXLANSOPRAZOLE 60 MG PO CPDR: 60.0000 mg | DELAYED_RELEASE_CAPSULE | Freq: Every day | ORAL | 3 refills | Status: DC

## 2021-05-12 DIAGNOSIS — M9903 Segmental and somatic dysfunction of lumbar region: Secondary | ICD-10-CM | POA: Diagnosis not present

## 2021-05-12 DIAGNOSIS — M5411 Radiculopathy, occipito-atlanto-axial region: Secondary | ICD-10-CM | POA: Diagnosis not present

## 2021-05-12 DIAGNOSIS — M9901 Segmental and somatic dysfunction of cervical region: Secondary | ICD-10-CM | POA: Diagnosis not present

## 2021-05-14 DIAGNOSIS — M5411 Radiculopathy, occipito-atlanto-axial region: Secondary | ICD-10-CM | POA: Diagnosis not present

## 2021-05-14 DIAGNOSIS — M9903 Segmental and somatic dysfunction of lumbar region: Secondary | ICD-10-CM | POA: Diagnosis not present

## 2021-05-14 DIAGNOSIS — M9901 Segmental and somatic dysfunction of cervical region: Secondary | ICD-10-CM | POA: Diagnosis not present

## 2021-05-19 DIAGNOSIS — M5411 Radiculopathy, occipito-atlanto-axial region: Secondary | ICD-10-CM | POA: Diagnosis not present

## 2021-05-19 DIAGNOSIS — M9901 Segmental and somatic dysfunction of cervical region: Secondary | ICD-10-CM | POA: Diagnosis not present

## 2021-05-19 DIAGNOSIS — M9903 Segmental and somatic dysfunction of lumbar region: Secondary | ICD-10-CM | POA: Diagnosis not present

## 2021-05-21 DIAGNOSIS — M5411 Radiculopathy, occipito-atlanto-axial region: Secondary | ICD-10-CM | POA: Diagnosis not present

## 2021-05-21 DIAGNOSIS — M9901 Segmental and somatic dysfunction of cervical region: Secondary | ICD-10-CM | POA: Diagnosis not present

## 2021-05-21 DIAGNOSIS — M9903 Segmental and somatic dysfunction of lumbar region: Secondary | ICD-10-CM | POA: Diagnosis not present

## 2021-05-25 DIAGNOSIS — M9903 Segmental and somatic dysfunction of lumbar region: Secondary | ICD-10-CM | POA: Diagnosis not present

## 2021-05-25 DIAGNOSIS — M9901 Segmental and somatic dysfunction of cervical region: Secondary | ICD-10-CM | POA: Diagnosis not present

## 2021-05-25 DIAGNOSIS — M5411 Radiculopathy, occipito-atlanto-axial region: Secondary | ICD-10-CM | POA: Diagnosis not present

## 2021-05-27 DIAGNOSIS — M5411 Radiculopathy, occipito-atlanto-axial region: Secondary | ICD-10-CM | POA: Diagnosis not present

## 2021-05-27 DIAGNOSIS — M9901 Segmental and somatic dysfunction of cervical region: Secondary | ICD-10-CM | POA: Diagnosis not present

## 2021-05-27 DIAGNOSIS — M9903 Segmental and somatic dysfunction of lumbar region: Secondary | ICD-10-CM | POA: Diagnosis not present

## 2021-06-03 DIAGNOSIS — M5411 Radiculopathy, occipito-atlanto-axial region: Secondary | ICD-10-CM | POA: Diagnosis not present

## 2021-06-03 DIAGNOSIS — M9901 Segmental and somatic dysfunction of cervical region: Secondary | ICD-10-CM | POA: Diagnosis not present

## 2021-06-03 DIAGNOSIS — M9903 Segmental and somatic dysfunction of lumbar region: Secondary | ICD-10-CM | POA: Diagnosis not present

## 2021-06-08 ENCOUNTER — Telehealth: Payer: Self-pay | Admitting: Medical

## 2021-06-08 NOTE — Telephone Encounter (Signed)
error 

## 2021-06-09 DIAGNOSIS — M9903 Segmental and somatic dysfunction of lumbar region: Secondary | ICD-10-CM | POA: Diagnosis not present

## 2021-06-09 DIAGNOSIS — M9901 Segmental and somatic dysfunction of cervical region: Secondary | ICD-10-CM | POA: Diagnosis not present

## 2021-06-09 DIAGNOSIS — M5411 Radiculopathy, occipito-atlanto-axial region: Secondary | ICD-10-CM | POA: Diagnosis not present

## 2021-06-11 DIAGNOSIS — E669 Obesity, unspecified: Secondary | ICD-10-CM | POA: Diagnosis not present

## 2021-06-11 DIAGNOSIS — E785 Hyperlipidemia, unspecified: Secondary | ICD-10-CM | POA: Diagnosis not present

## 2021-06-11 DIAGNOSIS — E890 Postprocedural hypothyroidism: Secondary | ICD-10-CM | POA: Diagnosis not present

## 2021-06-11 DIAGNOSIS — C73 Malignant neoplasm of thyroid gland: Secondary | ICD-10-CM | POA: Diagnosis not present

## 2021-06-16 ENCOUNTER — Other Ambulatory Visit: Payer: Self-pay

## 2021-06-16 ENCOUNTER — Ambulatory Visit: Payer: BC Managed Care – PPO | Admitting: Medical

## 2021-06-16 ENCOUNTER — Encounter: Payer: Self-pay | Admitting: Medical

## 2021-06-16 VITALS — BP 130/88 | HR 66 | Ht 71.0 in | Wt 281.6 lb

## 2021-06-16 DIAGNOSIS — R109 Unspecified abdominal pain: Secondary | ICD-10-CM

## 2021-06-16 DIAGNOSIS — M9901 Segmental and somatic dysfunction of cervical region: Secondary | ICD-10-CM | POA: Diagnosis not present

## 2021-06-16 DIAGNOSIS — Z113 Encounter for screening for infections with a predominantly sexual mode of transmission: Secondary | ICD-10-CM | POA: Diagnosis not present

## 2021-06-16 DIAGNOSIS — Z85850 Personal history of malignant neoplasm of thyroid: Secondary | ICD-10-CM

## 2021-06-16 DIAGNOSIS — R5383 Other fatigue: Secondary | ICD-10-CM | POA: Diagnosis not present

## 2021-06-16 DIAGNOSIS — B001 Herpesviral vesicular dermatitis: Secondary | ICD-10-CM

## 2021-06-16 DIAGNOSIS — M5411 Radiculopathy, occipito-atlanto-axial region: Secondary | ICD-10-CM | POA: Diagnosis not present

## 2021-06-16 DIAGNOSIS — E890 Postprocedural hypothyroidism: Secondary | ICD-10-CM | POA: Diagnosis not present

## 2021-06-16 DIAGNOSIS — M25561 Pain in right knee: Secondary | ICD-10-CM

## 2021-06-16 DIAGNOSIS — Z0000 Encounter for general adult medical examination without abnormal findings: Secondary | ICD-10-CM | POA: Diagnosis not present

## 2021-06-16 DIAGNOSIS — R140 Abdominal distension (gaseous): Secondary | ICD-10-CM

## 2021-06-16 DIAGNOSIS — E039 Hypothyroidism, unspecified: Secondary | ICD-10-CM

## 2021-06-16 DIAGNOSIS — Z131 Encounter for screening for diabetes mellitus: Secondary | ICD-10-CM | POA: Diagnosis not present

## 2021-06-16 DIAGNOSIS — M9903 Segmental and somatic dysfunction of lumbar region: Secondary | ICD-10-CM | POA: Diagnosis not present

## 2021-06-16 DIAGNOSIS — G8929 Other chronic pain: Secondary | ICD-10-CM | POA: Insufficient documentation

## 2021-06-16 DIAGNOSIS — Z2821 Immunization not carried out because of patient refusal: Secondary | ICD-10-CM

## 2021-06-16 DIAGNOSIS — Z1211 Encounter for screening for malignant neoplasm of colon: Secondary | ICD-10-CM | POA: Diagnosis not present

## 2021-06-16 DIAGNOSIS — Z6839 Body mass index (BMI) 39.0-39.9, adult: Secondary | ICD-10-CM | POA: Insufficient documentation

## 2021-06-16 DIAGNOSIS — Z7185 Encounter for immunization safety counseling: Secondary | ICD-10-CM | POA: Diagnosis not present

## 2021-06-16 DIAGNOSIS — K219 Gastro-esophageal reflux disease without esophagitis: Secondary | ICD-10-CM

## 2021-06-16 DIAGNOSIS — E782 Mixed hyperlipidemia: Secondary | ICD-10-CM | POA: Insufficient documentation

## 2021-06-16 NOTE — Progress Notes (Signed)
Subjective:   HPI  Bradley Watts is a 45 y.o. male who presents for Chief Complaint  Patient presents with   nonfasting cpe    Nonfasting cpe, numbness in hands on occasion     Patient Care Team: Labaron Digirolamo, Leward Quan as PCP - General Sees dentist Sees eye doctor Dr. Carrolyn Meiers, endocrinology Dr. Purcell Nails, chiropractor  Concerns: Likes fried chicken, stress eats at times,sometimes eats good , sometimes bad.  Has some intermittent tingling in hands, feels fatigued a lot.  Not motivated at times for exercise  History of thyroid cancer.  Still sees Dr. Forde Dandy regularly  He reports chronic right knee pain at times he has pain that feels like the knee can lock up.  No injury no trauma.  This has been going on for years.  He sees Dr. Owens Shark chiropractor quite regularly  Reviewed their medical, surgical, family, social, medication, and allergy history and updated chart as appropriate.  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 Jerene Canny   Recurrent cold sores    Wears glasses     Past Surgical History:  Procedure Laterality Date   CLOSED REDUCTION TIBIAL FRACTURE     left   THYROIDECTOMY  7/11    Family History  Problem Relation Age of Onset   Other Father        died in MVA   Diabetes Brother        type 1   Diabetes Daughter        type 1   Heart disease Maternal Grandfather 47   Cancer Paternal Grandfather        lung cancer   Hyperlipidemia Neg Hx    Hypertension Neg Hx    Stroke Neg Hx      Current Outpatient Medications:    levothyroxine (SYNTHROID, LEVOTHROID) 200 MCG tablet, Take 200 mcg by mouth daily., Disp: , Rfl:    pantoprazole (PROTONIX) 20 MG tablet, Take 20 mg by mouth 2 (two) times daily., Disp: , Rfl:    valACYclovir (VALTREX) 1000 MG tablet, TAKE 1 TABLET  BY MOUTH EVERY DAY FOR SUPPRESSION, 2 TABLETS TWICE DAILY FOR 1 DAY FOR FLARE UP, Disp: 270 tablet, Rfl: 1   dexlansoprazole (DEXILANT) 60 MG capsule, Take 1 capsule (60 mg total) by mouth daily. (Patient not taking: Reported on 06/16/2021), Disp: 90 capsule, Rfl: 3  No Known Allergies     Review of Systems Constitutional: -fever, -chills, -sweats, -unexpected weight change, -decreased appetite, +fatigue Allergy: -sneezing, -itching, -congestion Dermatology: -changing moles, --rash, -lumps ENT: -runny nose, -ear pain, -sore throat, -hoarseness, -sinus pain, -teeth pain, - ringing in ears, -hearing loss, -nosebleeds Cardiology: -chest pain, -palpitations, -swelling, -difficulty breathing when lying flat, -waking up short of breath Respiratory: -cough, -shortness of breath, -difficulty breathing with exercise or exertion, -wheezing, -coughing up blood Gastroenterology: -abdominal pain, -nausea, -vomiting, -diarrhea, -constipation, -blood in stool, -changes in bowel movement, -difficulty swallowing or eating Hematology: -bleeding, -bruising  Musculoskeletal: +joint aches, -muscle aches, -joint swelling, -back pain, -neck pain, -cramping, -changes in gait Ophthalmology: denies vision changes, eye redness, itching, discharge Urology: -burning with urination, -difficulty urinating, -blood in urine, -urinary frequency, -urgency, -incontinence Neurology: -headache, -weakness, -tingling, -numbness, -memory loss, -falls, -dizziness Psychology: -depressed mood, -agitation, -sleep problems Male GU: no testicular mass, pain, no lymph nodes swollen,  no swelling, no rash.     Objective:  BP 130/88   Pulse 66   Ht 5\' 11"  (1.803 m)   Wt 281 lb 9.6 oz (127.7 kg)   BMI 39.28 kg/m   General appearance: alert, no distress, WD/WN, Caucasian male unremarkable Skin: unremarkable HEENT: normocephalic, conjunctiva/corneas normal, sclerae anicteric, PERRLA, EOMi Neck: supple, no lymphadenopathy, no  thyromegaly, no masses, normal ROM, no bruits Chest: non tender, normal shape and expansion Heart: RRR, normal S1, S2, no murmurs Lungs: CTA bilaterally, no wheezes, rhonchi, or rales Abdomen: +bs, soft, non tender, non distended, no masses, no hepatomegaly, no splenomegaly, no bruits Back: non tender, normal ROM, no scoliosis Musculoskeletal: Right knee nontender, no laxity, no swelling, no deformity of legs, normal range of motion of legs bilaterally, upper extremities non tender, no obvious deformity, normal ROM throughout, lower extremities non tender, no obvious deformity, normal ROM throughout Extremities: no edema, no cyanosis, no clubbing Pulses: 2+ symmetric, upper and lower extremities, normal cap refill Neurological: alert, oriented x 3, CN2-12 intact, strength normal upper extremities and lower extremities, sensation normal throughout, DTRs 2+ throughout, no cerebellar signs, gait normal Psychiatric: normal affect, behavior normal, pleasant  GU: normal male external genitalia,circumcised, nontender, no masses, no hernia, no lymphadenopathy Rectal:deferred     Assessment and Plan :   Encounter Diagnoses  Name Primary?   Encounter for health maintenance examination in adult Yes   Screen for colon cancer    Screen for STD (sexually transmitted disease)    BMI 39.0-39.9,adult    Vaccine counseling    Screening for diabetes mellitus    S/P thyroidectomy    Recurrent cold sores    Influenza vaccination declined    Hypothyroidism, unspecified type    History of thyroid cancer    Gastroesophageal reflux disease, unspecified whether esophagitis present    Chronic abdominal pain    Bloating    Fatigue, unspecified type    Chronic pain of right knee    Mixed hyperlipidemia      This visit was a preventative care visit, also known as wellness visit or routine physical.   Topics typically include healthy lifestyle, diet, exercise, preventative care, vaccinations, sick and well  care, proper use of emergency dept and after hours care, as well as other concerns.     Recommendations: Continue to return yearly for your annual wellness and preventative care visits.  This gives [place] a chance to discuss healthy lifestyle, exercise, vaccinations, review your chart record, and perform screenings where appropriate.  I recommend you see your eye doctor yearly for routine vision care.  I recommend you see your dentist yearly for routine dental care including hygiene visits twice yearly.   Vaccination recommendations were reviewed Immunization History  Administered Date(s) Administered   Tdap 04/23/2013    Consider yearly flu vaccine   Screening for cancer: Colon cancer screening: We will refer you for screening colonoscopy. Inquire about endoscopy upper as well  Testicular cancer screening You should do a monthly self testicular exam if you are between 18-24 years old  We discussed PSA, prostate exam, and prostate cancer screening risks/benefits.    Age 13  Skin cancer screening: Check your skin regularly for new changes, growing lesions, or other lesions of concern Come in for evaluation if you have skin lesions of concern.  Lung cancer screening: If you have a greater than 20 pack year history of tobacco use, then you may qualify for lung cancer screening with a chest CT scan.  Please call your insurance company to inquire about coverage for this test.  We currently don't have screenings for other cancers besides breast, cervical, colon, and lung cancers.  If you have a strong family history of cancer or have other cancer screening concerns, please let me know.    Bone health: Get at least 150 minutes of aerobic exercise weekly Get weight bearing exercise at least once weekly Bone density test:  A bone density test is an imaging test that uses a type of X-ray to measure the amount of calcium and other minerals in your bones. The test may be used to diagnose  or screen you for a condition that causes weak or thin bones (osteoporosis), predict your risk for a broken bone (fracture), or determine how well your osteoporosis treatment is working. The bone density test is recommended for females 25 and older, or females or males <38 if certain risk factors such as thyroid disease, long term use of steroids such as for asthma or rheumatological issues, vitamin D deficiency, estrogen deficiency, family history of osteoporosis, self or family history of fragility fracture in first degree relative.    Heart health: Get at least 150 minutes of aerobic exercise weekly Limit alcohol It is important to maintain a healthy blood pressure and healthy cholesterol numbers  Heart disease screening: Screening for heart disease includes screening for blood pressure, fasting lipids, glucose/diabetes screening, BMI height to weight ratio, reviewed of smoking status, physical activity, and diet.    Goals include blood pressure 120/80 or less, maintaining a healthy lipid/cholesterol profile, preventing diabetes or keeping diabetes numbers under good control, not smoking or using tobacco products, exercising most days per week or at least 150 minutes per week of exercise, and eating healthy variety of fruits and vegetables, healthy oils, and avoiding unhealthy food choices like fried food, fast food, high sugar and high cholesterol foods.    Other tests may possibly include EKG test, CT coronary calcium score, echocardiogram, exercise treadmill stress test.   Consider coronary CT heart disease screen as we discussed   Medical care options: I recommend you continue to seek care here first for routine care.  We try really hard to have available appointments Monday through Friday daytime hours for sick visits, acute visits, and physicals.  Urgent care should be used for after hours and weekends for significant issues that cannot wait till the next day.  The emergency department  should be used for significant potentially life-threatening emergencies.  The emergency department is expensive, can often have long wait times for less significant concerns, so try to utilize primary care, urgent care, or telemedicine when possible to avoid unnecessary trips to the emergency department.  Virtual visits and telemedicine have been introduced since the pandemic started in 2020, and can be convenient ways to receive medical care.  We offer virtual appointments as well to assist you in a variety of options to seek medical care.    Separate significant issues discussed: Chronic right knee pain-go for baseline x-ray  Fatigue-consider sleep study, labs today  High cholesterol-consider statin cholesterol medicine to reduce risk of heart disease and stroke.  Recommend you eat a low-cholesterol diet and exercise regularly.  Reviewed labs that he had done this month through endocrinology.  Total cholesterol was over 260, LDL was over 130, triglycerides were around 161.  Obesity-I recommend weight loss through healthy diet and exercise.  Let me know if you want a referral to nutritionist or other means to help with strategies  to lose weight.  Continue routine follow-up with Dr. Forde Dandy for history of thyroid cancer, hypothyroidism  Chronic abdominal pain, bloating, GERD-referral to gastroenterology   Lisa was seen today for nonfasting cpe.  Diagnoses and all orders for this visit:  Encounter for health maintenance examination in adult -     Ambulatory referral to Gastroenterology -     CBC -     RPR+HIV+GC+CT Panel -     Basic metabolic panel -     Hemoglobin A1c -     Hepatitis C antibody -     Hepatitis B surface antigen -     Testosterone  Screen for colon cancer -     Ambulatory referral to Gastroenterology  Screen for STD (sexually transmitted disease) -     RPR+HIV+GC+CT Panel -     Hepatitis C antibody -     Hepatitis B surface antigen  BMI  39.0-39.9,adult  Vaccine counseling  Screening for diabetes mellitus -     Hemoglobin A1c  S/P thyroidectomy  Recurrent cold sores  Influenza vaccination declined  Hypothyroidism, unspecified type  History of thyroid cancer  Gastroesophageal reflux disease, unspecified whether esophagitis present -     Ambulatory referral to Gastroenterology  Chronic abdominal pain -     Ambulatory referral to Gastroenterology  Bloating -     Ambulatory referral to Gastroenterology  Fatigue, unspecified type -     Testosterone  Chronic pain of right knee -     DG Knee Complete 4 Views Right; Future  Mixed hyperlipidemia   Follow-up pending labs, yearly for physical

## 2021-06-16 NOTE — Patient Instructions (Addendum)
Please go to [facility] for your right knee xray.   Their hours are 8am - 4:30 pm Monday - Friday.  Take your insurance card with you.  [facility] [phone]  [address], [place], [place] [address]  [address], [place] [address]    Optometrists nearby  [facility] [address]oni [address] [place], [place] [address] [phone]  Dr. Blair Heys [address] [place], [place] [address] [phone]  [facility] [address] [place] Re[place], [place] [address] [phone]  [facility], Dr. Idolina Primer [place], [place], [place] [address] [phone]     DENTIST RECOMMENDATION Dr. Jonna Coup, dentist [address], [place], [place] [address] [phone] [email or web address]    Recommendations: Continue to return yearly for your annual wellness and preventative care visits.  This gives [place] a chance to discuss healthy lifestyle, exercise, vaccinations, review your chart record, and perform screenings where appropriate.  I recommend you see your eye doctor yearly for routine vision care.  I recommend you see your dentist yearly for routine dental care including hygiene visits twice yearly.   Vaccination recommendations were reviewed Immunization History  Administered Date(s) Administered   Tdap 04/23/2013    Consider yearly flu vaccine   Screening for cancer: Colon cancer screening: We will refer you for screening colonoscopy. Inquire about endoscopy upper as well  Testicular cancer screening You should do a monthly self testicular exam if you are between 7-57 years old  We discussed PSA, prostate exam, and prostate cancer screening risks/benefits.    Age 45  Skin cancer screening: Check your skin regularly for new changes, growing lesions, or other lesions of concern Come in for evaluation if you have skin lesions of concern.  Lung cancer screening: If you have a greater  than 20 pack year history of tobacco use, then you may qualify for lung cancer screening with a chest CT scan.   Please call your insurance company to inquire about coverage for this test.  We currently don't have screenings for other cancers besides breast, cervical, colon, and lung cancers.  If you have a strong family history of cancer or have other cancer screening concerns, please let me know.    Bone health: Get at least 150 minutes of aerobic exercise weekly Get weight bearing exercise at least once weekly Bone density test:  A bone density test is an imaging test that uses a type of X-ray to measure the amount of calcium and other minerals in your bones. The test may be used to diagnose or screen you for a condition that causes weak or thin bones (osteoporosis), predict your risk for a broken bone (fracture), or determine how well your osteoporosis treatment is working. The bone density test is recommended for females 51 and older, or females or males <90 if certain risk factors such as thyroid disease, long term use of steroids such as for asthma or rheumatological issues, vitamin D deficiency, estrogen deficiency, family history of osteoporosis, self or family history of fragility fracture in first degree relative.    Heart health: Get at least 150 minutes of aerobic exercise weekly Limit alcohol It is important to maintain a healthy blood pressure and healthy cholesterol numbers  Heart disease screening: Screening for heart disease includes screening for blood pressure, fasting lipids, glucose/diabetes screening, BMI height to weight ratio, reviewed of smoking status, physical activity, and diet.    Goals include blood pressure 120/80 or less,  maintaining a healthy lipid/cholesterol profile, preventing diabetes or keeping diabetes numbers under good control, not smoking or using tobacco products, exercising most days per week or at least 150 minutes per week of exercise, and eating  healthy variety of fruits and vegetables, healthy oils, and avoiding unhealthy food choices like fried food, fast food, high sugar and high cholesterol foods.    Other tests may possibly include EKG test, CT coronary calcium score, echocardiogram, exercise treadmill stress test.   Consider coronary CT heart disase screen as we discussed   Medical care options: I recommend you continue to seek care here first for routine care.  We try really hard to have available appointments Monday through Friday daytime hours for sick visits, acute visits, and physicals.  Urgent care should be used for after hours and weekends for significant issues that cannot wait till the next day.  The emergency department should be used for significant potentially life-threatening emergencies.  The emergency department is expensive, can often have long wait times for less significant concerns, so try to utilize primary care, urgent care, or telemedicine when possible to avoid unnecessary trips to the emergency department.  Virtual visits and telemedicine have been introduced since the pandemic started in 2020, and can be convenient ways to receive medical care.  We offer virtual appointments as well to assist you in a variety of options to seek medical care.    Separate significant issues discussed: Chronic right knee pain-go for baseline x-ray  Fatigue-consider sleep study, labs today  High cholesterol-consider statin cholesterol medicine to reduce risk of heart disease and stroke.  Recommend you eat a low-cholesterol diet and exercise regularly  Obesity-I recommend weight loss through healthy diet and exercise.  Let me know if you want a referral to nutritionist or other means to help with strategies to lose weight.  Continue routine follow-up with Dr. Forde Dandy for history of thyroid cancer, hypothyroidism  Chronic abdominal pain, bloating, GERD-referral to gastroenterology

## 2021-06-17 ENCOUNTER — Ambulatory Visit
Admission: RE | Admit: 2021-06-17 | Discharge: 2021-06-17 | Disposition: A | Discharge status: Home / Self Care | Payer: PRIVATE HEALTH INSURANCE | Source: Ambulatory Visit | Attending: Medical | Admitting: Medical

## 2021-06-17 DIAGNOSIS — G8929 Other chronic pain: Secondary | ICD-10-CM

## 2021-06-17 LAB — TESTOSTERONE: Testosterone: 245 ng/dL — ABNORMAL LOW (ref 264–916)

## 2021-06-18 ENCOUNTER — Other Ambulatory Visit: Payer: Self-pay | Admitting: Medical

## 2021-06-19 LAB — HEMOGLOBIN A1C
Est. average glucose Bld gHb Est-mCnc: 114 mg/dL
Hgb A1c MFr Bld: 5.6 % (ref 4.8–5.6)

## 2021-06-19 LAB — BASIC METABOLIC PANEL
BUN/Creatinine Ratio: 19 (ref 9–20)
BUN: 19 mg/dL (ref 6–24)
CO2: 20 mmol/L (ref 20–29)
Calcium: 9.4 mg/dL (ref 8.7–10.2)
Chloride: 105 mmol/L (ref 96–106)
Creatinine, Ser: 1.02 mg/dL (ref 0.76–1.27)
Glucose: 85 mg/dL (ref 65–99)
Potassium: 4.4 mmol/L (ref 3.5–5.2)
Sodium: 144 mmol/L (ref 134–144)
eGFR: 92 mL/min/{1.73_m2} (ref >59)

## 2021-06-19 LAB — RPR+HIV+GC+CT PANEL
Chlamydia trachomatis, NAA: NEGATIVE
HIV Screen 4th Generation wRfx: NONREACTIVE
Neisseria Gonorrhoeae by PCR: NEGATIVE
RPR Ser Ql: NONREACTIVE

## 2021-06-19 LAB — CBC
Hematocrit: 47.1 % (ref 37.5–51.0)
Hemoglobin: 16.1 g/dL (ref 13.0–17.7)
MCH: 32.3 pg (ref 26.6–33.0)
MCHC: 34.2 g/dL (ref 31.5–35.7)
MCV: 95 fL (ref 79–97)
Platelets: 206 10*3/uL (ref 150–450)
RBC: 4.98 x10E6/uL (ref 4.14–5.80)
RDW: 12.1 % (ref 11.6–15.4)
WBC: 4.8 10*3/uL (ref 3.4–10.8)

## 2021-06-19 LAB — HEPATITIS B SURFACE ANTIGEN: Hepatitis B Surface Ag: NEGATIVE

## 2021-06-19 LAB — HEPATITIS C ANTIBODY: Hep C Virus Ab: <0.1 s/co ratio (ref 0.0–0.9)

## 2021-06-24 ENCOUNTER — Telehealth: Payer: Self-pay | Admitting: Medical

## 2021-06-24 NOTE — Telephone Encounter (Signed)
Requested records received from [facility]

## 2021-06-25 DIAGNOSIS — M5411 Radiculopathy, occipito-atlanto-axial region: Secondary | ICD-10-CM | POA: Diagnosis not present

## 2021-06-25 DIAGNOSIS — M9901 Segmental and somatic dysfunction of cervical region: Secondary | ICD-10-CM | POA: Diagnosis not present

## 2021-06-25 DIAGNOSIS — M9903 Segmental and somatic dysfunction of lumbar region: Secondary | ICD-10-CM | POA: Diagnosis not present

## 2021-06-30 DIAGNOSIS — M9901 Segmental and somatic dysfunction of cervical region: Secondary | ICD-10-CM | POA: Diagnosis not present

## 2021-06-30 DIAGNOSIS — M5411 Radiculopathy, occipito-atlanto-axial region: Secondary | ICD-10-CM | POA: Diagnosis not present

## 2021-06-30 DIAGNOSIS — M9903 Segmental and somatic dysfunction of lumbar region: Secondary | ICD-10-CM | POA: Diagnosis not present

## 2021-07-03 ENCOUNTER — Other Ambulatory Visit: Payer: Self-pay

## 2021-07-03 ENCOUNTER — Ambulatory Visit: Payer: BC Managed Care – PPO | Admitting: Medical

## 2021-07-03 VITALS — BP 130/82 | HR 57 | Wt 279.4 lb

## 2021-07-03 DIAGNOSIS — R5383 Other fatigue: Secondary | ICD-10-CM | POA: Diagnosis not present

## 2021-07-03 DIAGNOSIS — Z79899 Other long term (current) drug therapy: Secondary | ICD-10-CM

## 2021-07-03 DIAGNOSIS — R7989 Other specified abnormal findings of blood chemistry: Secondary | ICD-10-CM

## 2021-07-03 DIAGNOSIS — R1314 Dysphagia, pharyngoesophageal phase: Secondary | ICD-10-CM | POA: Diagnosis not present

## 2021-07-03 DIAGNOSIS — Z125 Encounter for screening for malignant neoplasm of prostate: Secondary | ICD-10-CM | POA: Diagnosis not present

## 2021-07-03 DIAGNOSIS — R197 Diarrhea, unspecified: Secondary | ICD-10-CM | POA: Diagnosis not present

## 2021-07-03 DIAGNOSIS — Z6836 Body mass index (BMI) 36.0-36.9, adult: Secondary | ICD-10-CM

## 2021-07-03 DIAGNOSIS — E039 Hypothyroidism, unspecified: Secondary | ICD-10-CM

## 2021-07-03 DIAGNOSIS — K219 Gastro-esophageal reflux disease without esophagitis: Secondary | ICD-10-CM | POA: Diagnosis not present

## 2021-07-03 MED ORDER — DIETHYLPROPION HCL ER 75 MG PO TB24: 1.0000 | ORAL_TABLET | Freq: Every day | ORAL | 0 refills | Status: DC

## 2021-07-03 NOTE — Progress Notes (Signed)
Subjective:  Bradley Watts is a 45 y.o. male who presents for Chief Complaint  Patient presents with   testosterone follow-up    Testosterone follow-up      Here for follow-up on recent labs.  His testosterone was low.  He discussed neck steps.  He may be interested in pursuing therapy.  He does note fatigue.  He is here for additional lab work related to low testosterone evaluation.  He does not want to pursue sleep study at this time.  We discussed this last visit  He is interested in medicine to help lose weight.  He is trying to eat healthy and exercise but walks a little help in this regard.  He saw gastroenterology recently to establish care and has his colonoscopy planned in January  No other aggravating or relieving factors.    No other c/o.  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 Jerene Watts   Recurrent cold sores    Wears glasses    Current Outpatient Medications on File Prior to Visit  Medication Sig Dispense Refill   dexlansoprazole (DEXILANT) 60 MG capsule Take 1 capsule by mouth 2 (two) times daily.     levothyroxine (SYNTHROID, LEVOTHROID) 200 MCG tablet Take 200 mcg by mouth daily.     valACYclovir (VALTREX) 1000 MG tablet TAKE 1 TABLET BY MOUTH EVERY DAY FOR SUPPRESSION, 2 TABLETS TWICE DAILY FOR 1 DAY FOR FLARE UP 270 tablet 1   No current facility-administered medications on file prior to visit.     The following portions of the patient's history were reviewed and updated as appropriate: allergies, current medications, past family history, past medical history, past social history, past surgical history and problem list.  ROS Otherwise as in subjective above  Objective: BP 130/82   Pulse (!) 57   Wt 279 lb 6.4 oz (126.7 kg)   BMI 38.97 kg/m   General appearance:  alert, no distress, well developed, well nourished     Assessment: Encounter Diagnoses  Name Primary?   Low testosterone Yes   High risk medication use    Other fatigue    Screening for prostate cancer    Class 2 severe obesity with serious comorbidity and body mass index (BMI) of 36.0 to 36.9 in adult, unspecified obesity type (HCC)    Hypothyroidism, unspecified type      Plan: Low testosterone-additional labs today.  We discussed lab evaluation, potential treatment options, risk and benefits and proper use of different treatment options.  Fatigue-consider sleep study.  Additional evaluation for low testosterone today.  Additional lab work related to potential for treatment for testosterone surveillance  Obesity-discussed potential options for medication.  Begin diethylpropion.  Discussed risk and benefits of medication, proper use of medication.  Continue efforts to work with healthy diet, exercise regularly, set short-term goals.  Hypothyroidism-on therapy, managed by endocrinology    Bradley Watts was seen today for testosterone follow-up.  Diagnoses and all orders for this visit:  Low testosterone -     Prolactin -     FSH/LH -     PSA -     Testosterone  High risk medication use  Other fatigue  Screening for prostate cancer -     PSA  Class 2 severe obesity with serious comorbidity and body mass index (BMI) of 36.0  to 36.9 in adult, unspecified obesity type ([place])  Hypothyroidism, unspecified type  Other orders -     Diethylpropion HCl CR 75 MG TB24; Take 1 tablet (75 mg total) by mouth daily.    Follow up: pending labs

## 2021-07-04 LAB — TESTOSTERONE: Testosterone: 276 ng/dL (ref 264–916)

## 2021-07-04 LAB — PROLACTIN: Prolactin: 5.7 ng/mL (ref 4.0–15.2)

## 2021-07-04 LAB — FSH/LH
FSH: 4.1 m[IU]/mL (ref 1.5–12.4)
LH: 5.3 m[IU]/mL (ref 1.7–8.6)

## 2021-07-04 LAB — PSA: Prostate Specific Ag, Serum: 0.4 ng/mL (ref 0.0–4.0)

## 2021-07-09 DIAGNOSIS — M9901 Segmental and somatic dysfunction of cervical region: Secondary | ICD-10-CM | POA: Diagnosis not present

## 2021-07-09 DIAGNOSIS — M9903 Segmental and somatic dysfunction of lumbar region: Secondary | ICD-10-CM | POA: Diagnosis not present

## 2021-07-09 DIAGNOSIS — M5411 Radiculopathy, occipito-atlanto-axial region: Secondary | ICD-10-CM | POA: Diagnosis not present

## 2021-07-13 DIAGNOSIS — R197 Diarrhea, unspecified: Secondary | ICD-10-CM | POA: Diagnosis not present

## 2021-07-16 DIAGNOSIS — M9901 Segmental and somatic dysfunction of cervical region: Secondary | ICD-10-CM | POA: Diagnosis not present

## 2021-07-16 DIAGNOSIS — M5411 Radiculopathy, occipito-atlanto-axial region: Secondary | ICD-10-CM | POA: Diagnosis not present

## 2021-07-16 DIAGNOSIS — M9903 Segmental and somatic dysfunction of lumbar region: Secondary | ICD-10-CM | POA: Diagnosis not present

## 2021-07-29 DIAGNOSIS — M9903 Segmental and somatic dysfunction of lumbar region: Secondary | ICD-10-CM | POA: Diagnosis not present

## 2021-07-29 DIAGNOSIS — M9901 Segmental and somatic dysfunction of cervical region: Secondary | ICD-10-CM | POA: Diagnosis not present

## 2021-07-29 DIAGNOSIS — M5411 Radiculopathy, occipito-atlanto-axial region: Secondary | ICD-10-CM | POA: Diagnosis not present

## 2021-08-03 ENCOUNTER — Telehealth: Payer: Self-pay | Admitting: Medical

## 2021-08-03 MED ORDER — DIETHYLPROPION HCL ER 75 MG PO TB24: 1.0000 | ORAL_TABLET | Freq: Every day | ORAL | 0 refills | Status: DC

## 2021-08-03 NOTE — Telephone Encounter (Signed)
See his email message.  I do need to see him back in to document blood pressure, pulse and weight since he has been on the diethylpropion.  I will go ahead and send a refill but he needs to come back in for follow-up visit since this is a new start.

## 2021-08-03 NOTE — Telephone Encounter (Signed)
Pt advised and scheduled.

## 2021-08-09 ENCOUNTER — Telehealth: Payer: Self-pay

## 2021-08-09 NOTE — Telephone Encounter (Signed)
P.A. DIETHYLPROPION

## 2021-08-14 NOTE — Telephone Encounter (Signed)
PA approved, pt informed.

## 2021-08-19 ENCOUNTER — Other Ambulatory Visit: Payer: Self-pay

## 2021-08-19 ENCOUNTER — Ambulatory Visit: Payer: BC Managed Care – PPO | Admitting: Medical

## 2021-08-19 VITALS — BP 120/80 | HR 63 | Wt 265.2 lb

## 2021-08-19 DIAGNOSIS — M5411 Radiculopathy, occipito-atlanto-axial region: Secondary | ICD-10-CM | POA: Diagnosis not present

## 2021-08-19 DIAGNOSIS — E782 Mixed hyperlipidemia: Secondary | ICD-10-CM

## 2021-08-19 DIAGNOSIS — M9903 Segmental and somatic dysfunction of lumbar region: Secondary | ICD-10-CM | POA: Diagnosis not present

## 2021-08-19 DIAGNOSIS — K219 Gastro-esophageal reflux disease without esophagitis: Secondary | ICD-10-CM

## 2021-08-19 DIAGNOSIS — E039 Hypothyroidism, unspecified: Secondary | ICD-10-CM

## 2021-08-19 DIAGNOSIS — M9901 Segmental and somatic dysfunction of cervical region: Secondary | ICD-10-CM | POA: Diagnosis not present

## 2021-08-19 DIAGNOSIS — Z6836 Body mass index (BMI) 36.0-36.9, adult: Secondary | ICD-10-CM

## 2021-08-19 MED ORDER — DIETHYLPROPION HCL ER 75 MG PO TB24: 1.0000 | ORAL_TABLET | Freq: Every day | ORAL | 1 refills | Status: DC

## 2021-08-19 NOTE — Patient Instructions (Addendum)
Over the next 2 months, consider using 1/2 tablet Diethylpropion daily or 1 tablet every other day to minimize cardiac risk or other side effects  We don't want to necessarily continue this medication for more than 2 months at a time.  However, a different medicaiton, Qsymia which has a similar component has been studies and is safer for chronic weight management.  Check insurance coverage for Qsymia.    Work on increasing exercise  Continue your efforts with healthy diet.   Monitor your blood pressures if possible.  Goal is to be <130/80.

## 2021-08-19 NOTE — Progress Notes (Signed)
Subjective:  Bradley Watts is a 45 y.o. male who presents for Chief Complaint  Patient presents with   follow-up    Follow-up on medication. Working well      Here for follow-up on medications.  After his last visit he started diethylpropion help with this.  He feels like this is working well.  He denies any side effects of the medication. This medication really helps curb appetite.   Takes it between 8-9am.   Does sometimes feel hungry at dinner time.  Exercise- not a lot, still have to work on this issue.  Nutrition - has been more careful with carb and calorie intake.    GERD-he is still taking Dexilant regularly.  Is avoiding GERD triggers particularly since working on weight loss efforts.  However ,recently [place] food aggravated his GERD.  No other aggravating or relieving factors.    No other c/o.  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 Jerene Canny   Recurrent cold sores    Wears glasses    Current Outpatient Medications on File Prior to Visit  Medication Sig Dispense Refill   dexlansoprazole (DEXILANT) 60 MG capsule Take 1 capsule by mouth 2 (two) times daily.     Diethylpropion HCl CR 75 MG TB24 Take 1 tablet (75 mg total) by mouth daily. 30 tablet 0   levothyroxine (SYNTHROID, LEVOTHROID) 200 MCG tablet Take 200 mcg by mouth daily.     valACYclovir (VALTREX) 1000 MG tablet TAKE 1 TABLET BY MOUTH EVERY DAY FOR SUPPRESSION, 2 TABLETS TWICE DAILY FOR 1 DAY FOR FLARE UP 270 tablet 1   No current facility-administered medications on file prior to visit.    The following portions of the patient's history were reviewed and updated as appropriate: allergies, current medications, past family history, past medical history, past social history, past surgical history and problem  list.  ROS Otherwise as in subjective above    Objective: BP 120/80   Pulse 63   Wt 265 lb 3.2 oz (120.3 kg)   BMI 36.99 kg/m   Wt Readings from Last 3 Encounters:  08/19/21 265 lb 3.2 oz (120.3 kg)  07/03/21 279 lb 6.4 oz (126.7 kg)  06/16/21 281 lb 9.6 oz (127.7 kg)   General appearance: alert, no distress, well developed, well nourished Heart: RRR, normal S1, S2, no murmurs Pulses: 2+ radial pulses, 2+ pedal pulses, normal cap refill Ext: no edema     Assessment: Encounter Diagnoses  Name Primary?   Hypothyroidism, unspecified type Yes   Class 2 severe obesity with serious comorbidity and body mass index (BMI) of 36.0 to 36.9 in adult, unspecified obesity type (HCC)    Mixed hyperlipidemia    Gastroesophageal reflux disease, unspecified whether esophagitis present      Plan: Hypothyroidism-compliant with medication, managed by endocrinology  Obesity-congratulated him on his recent weight loss.  Continue efforts with healthy diet.  I did encourage him to get more exercise in.  Discussed options for this.  Continue diethylpropion for now.  We will try to transition to Qsymia if covered by insurance for safety.  Discussed risk and benefits and proper use of medications both diethylpropion and Qsymia.  So far he has tolerated diethylpropion really well  Hyperlipidemia-using diet control  GERD-continue, Continue efforts to lose weight and avoid GERD triggers  Burl was seen today for follow-up.  Diagnoses and all orders for this visit:  Hypothyroidism, unspecified type  Class 2 severe obesity with serious comorbidity and body mass index (BMI) of 36.0 to 36.9 in adult, unspecified obesity type (HCC)  Mixed hyperlipidemia  Gastroesophageal reflux disease, unspecified whether esophagitis present  Other orders -     Diethylpropion HCl CR 75 MG TB24; Take 1 tablet (75 mg total) by mouth daily.   Follow up: call report 6-8 weeks

## 2021-09-04 DIAGNOSIS — R7989 Other specified abnormal findings of blood chemistry: Secondary | ICD-10-CM | POA: Insufficient documentation

## 2021-09-04 DIAGNOSIS — Z79899 Other long term (current) drug therapy: Secondary | ICD-10-CM | POA: Insufficient documentation

## 2021-09-16 DIAGNOSIS — M5411 Radiculopathy, occipito-atlanto-axial region: Secondary | ICD-10-CM | POA: Diagnosis not present

## 2021-09-16 DIAGNOSIS — M9903 Segmental and somatic dysfunction of lumbar region: Secondary | ICD-10-CM | POA: Diagnosis not present

## 2021-09-16 DIAGNOSIS — M9901 Segmental and somatic dysfunction of cervical region: Secondary | ICD-10-CM | POA: Diagnosis not present

## 2021-10-20 DIAGNOSIS — M5411 Radiculopathy, occipito-atlanto-axial region: Secondary | ICD-10-CM | POA: Diagnosis not present

## 2021-10-20 DIAGNOSIS — M9903 Segmental and somatic dysfunction of lumbar region: Secondary | ICD-10-CM | POA: Diagnosis not present

## 2021-10-20 DIAGNOSIS — M9901 Segmental and somatic dysfunction of cervical region: Secondary | ICD-10-CM | POA: Diagnosis not present

## 2021-10-26 DIAGNOSIS — R12 Heartburn: Secondary | ICD-10-CM | POA: Diagnosis not present

## 2021-10-26 DIAGNOSIS — K625 Hemorrhage of anus and rectum: Secondary | ICD-10-CM | POA: Diagnosis not present

## 2021-10-26 DIAGNOSIS — K449 Diaphragmatic hernia without obstruction or gangrene: Secondary | ICD-10-CM | POA: Diagnosis not present

## 2021-10-26 DIAGNOSIS — Z1211 Encounter for screening for malignant neoplasm of colon: Secondary | ICD-10-CM | POA: Diagnosis not present

## 2021-10-26 DIAGNOSIS — K5730 Diverticulosis of large intestine without perforation or abscess without bleeding: Secondary | ICD-10-CM | POA: Diagnosis not present

## 2021-10-26 DIAGNOSIS — K2930 Chronic superficial gastritis without bleeding: Secondary | ICD-10-CM | POA: Diagnosis not present

## 2021-10-26 DIAGNOSIS — K648 Other hemorrhoids: Secondary | ICD-10-CM | POA: Diagnosis not present

## 2021-10-26 DIAGNOSIS — K2970 Gastritis, unspecified, without bleeding: Secondary | ICD-10-CM | POA: Diagnosis not present

## 2021-10-26 LAB — HM COLONOSCOPY

## 2021-11-11 DIAGNOSIS — M9903 Segmental and somatic dysfunction of lumbar region: Secondary | ICD-10-CM | POA: Diagnosis not present

## 2021-11-11 DIAGNOSIS — M5411 Radiculopathy, occipito-atlanto-axial region: Secondary | ICD-10-CM | POA: Diagnosis not present

## 2021-11-11 DIAGNOSIS — M9901 Segmental and somatic dysfunction of cervical region: Secondary | ICD-10-CM | POA: Diagnosis not present

## 2021-11-12 ENCOUNTER — Other Ambulatory Visit: Payer: Self-pay | Admitting: Medical

## 2021-11-12 ENCOUNTER — Ambulatory Visit: Payer: BC Managed Care – PPO | Admitting: Medical

## 2021-11-12 ENCOUNTER — Other Ambulatory Visit: Payer: Self-pay

## 2021-11-12 VITALS — BP 120/70 | HR 68 | Wt 254.0 lb

## 2021-11-12 DIAGNOSIS — R7989 Other specified abnormal findings of blood chemistry: Secondary | ICD-10-CM

## 2021-11-12 DIAGNOSIS — R5383 Other fatigue: Secondary | ICD-10-CM

## 2021-11-12 DIAGNOSIS — E559 Vitamin D deficiency, unspecified: Secondary | ICD-10-CM | POA: Diagnosis not present

## 2021-11-12 DIAGNOSIS — E039 Hypothyroidism, unspecified: Secondary | ICD-10-CM

## 2021-11-12 NOTE — Progress Notes (Signed)
Subjective:  Bradley Watts is a 46 y.o. male who presents for Chief Complaint  Patient presents with   follow-up     Follow-up on weight. Would like to have testosterone level rechecked     Here for follow-up on medications.  In recent months he has been using diethylpropion to help with weight loss.  He has lost significant weight in recent months mostly through medication and dietary changes as he is not exercising much.  He is having trouble finding the time to do the exercise.  The best weight he has ever been was 225 pounds in the past and felt like he was in the best shape ever.  He is happy with his weight loss so far but wants to continue this.  In recent months we have discussed other medicine such as Wegovy and Qsymia.  He did not do well on phentermine in the past.  Given his thyroid issues Mancel Parsons may not be an option.  He still notes a lot of problems with decreased energy despite the recent weight loss changes.  He does not feel motivated to exercise with lack of energy.  He wants to revisit his testosterone since it has been low prior.  He does not plan to have any more children necessarily.  No other aggravating or relieving factors.    No other c/o.  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 Jerene Watts   Recurrent cold sores    Wears glasses    Current Outpatient Medications on File Prior to Visit  Medication Sig Dispense Refill   dexlansoprazole (DEXILANT) 60 MG capsule Take 1 capsule by mouth 2 (two) times daily.     levothyroxine (SYNTHROID, LEVOTHROID) 200 MCG tablet Take 200 mcg by mouth daily.     valACYclovir (VALTREX) 1000 MG tablet TAKE 1 TABLET BY MOUTH EVERY DAY FOR SUPPRESSION, 2 TABLETS TWICE DAILY FOR 1 DAY FOR FLARE UP 270 tablet 1   Diethylpropion HCl CR 75 MG TB24 Take  1 tablet (75 mg total) by mouth daily. 30 tablet 0   No current facility-administered medications on file prior to visit.    The following portions of the patient's history were reviewed and updated as appropriate: allergies, current medications, past family history, past medical history, past social history, past surgical history and problem list.  ROS Otherwise as in subjective above    Objective: BP 120/70    Pulse 68    Wt 254 lb (115.2 kg)    BMI 35.43 kg/m   BP Readings from Last 3 Encounters:  11/12/21 120/70  08/19/21 120/80  07/03/21 130/82    Wt Readings from Last 3 Encounters:  11/12/21 254 lb (115.2 kg)  08/19/21 265 lb 3.2 oz (120.3 kg)  07/03/21 279 lb 6.4 oz (126.7 kg)   General appearance: alert, no distress, well developed, well nourished Heart: RRR, normal S1, S2, no murmurs Pulses: 2+ radial pulses, 2+ pedal pulses, normal cap refill Ext: no edema     Assessment: Encounter Diagnoses  Name Primary?   Hypothyroidism, unspecified type Yes   Low testosterone    Fatigue, unspecified type       Plan: Hypothyroidism-compliant with medication, managed by endocrinology, Dr. Forde Watts  Obesity-congratulated him on his recent weight loss.  Continue efforts with healthy diet.  I did encourage  him to get more exercise in.  Discussed strategies for this.  Continue diethylpropion for now, but change to 1/2 tablet every other day for now as this is not really a long-term safe medication.   So far he has tolerated diethylpropion really well.  I will reach out to Dr. Forde Watts his endocrinologist.  I doubt Mancel Parsons is an option given his thyroid history.  We discussed considering Qsymia.  Discussed proper use of Qsymia, risk and benefits of medication.  Fatigue-we discussed possible causes.  I will recheck some labs today as below.  Consider sleep study.  Low testosterone-recheck levels today.  If still low consider therapy for low testosterone in conjunction with his  endocrinologist  Bradley Watts was seen today for follow-up .  Diagnoses and all orders for this visit:  Hypothyroidism, unspecified type  Low testosterone -     Testosterone  Fatigue, unspecified type -     Testosterone -     Vitamin B12 -     VITAMIN D 25 Hydroxy (Vit-D Deficiency, Fractures)    Follow up: pending call back

## 2021-11-13 ENCOUNTER — Other Ambulatory Visit: Payer: Self-pay | Admitting: Medical

## 2021-11-13 LAB — VITAMIN B12: Vitamin B-12: 367 pg/mL (ref 232–1245)

## 2021-11-13 LAB — TESTOSTERONE: Testosterone: 314 ng/dL (ref 264–916)

## 2021-11-13 LAB — VITAMIN D 25 HYDROXY (VIT D DEFICIENCY, FRACTURES): Vit D, 25-Hydroxy: 14.6 ng/mL — ABNORMAL LOW (ref 30.0–100.0)

## 2021-11-13 MED ORDER — VITAMIN D 25 MCG (1000 UNIT) PO TABS: 1000.0000 [IU] | ORAL_TABLET | Freq: Every day | ORAL | 3 refills | Status: DC

## 2021-11-13 MED ORDER — WEGOVY 0.25 MG/0.5ML ~~LOC~~ SOAJ: 0.2500 mg | SUBCUTANEOUS | 1 refills | Status: DC

## 2021-11-20 ENCOUNTER — Telehealth: Payer: Self-pay

## 2021-11-20 NOTE — Telephone Encounter (Signed)
P.A. WEGOVY done and completed and approved til 9/23, called pharmacy & went thru for $25, activated discount card went thru for $0.  Pt informed

## 2021-11-25 DIAGNOSIS — M9901 Segmental and somatic dysfunction of cervical region: Secondary | ICD-10-CM | POA: Diagnosis not present

## 2021-11-25 DIAGNOSIS — M5411 Radiculopathy, occipito-atlanto-axial region: Secondary | ICD-10-CM | POA: Diagnosis not present

## 2021-11-25 DIAGNOSIS — M9903 Segmental and somatic dysfunction of lumbar region: Secondary | ICD-10-CM | POA: Diagnosis not present

## 2021-11-26 ENCOUNTER — Telehealth: Payer: Self-pay

## 2021-11-26 ENCOUNTER — Other Ambulatory Visit: Payer: Self-pay | Admitting: Medical

## 2021-11-26 MED ORDER — VITAMIN D (ERGOCALCIFEROL) 1.25 MG (50000 UNIT) PO CAPS: 50000.0000 [IU] | ORAL_CAPSULE | ORAL | 0 refills | Status: DC

## 2021-11-26 NOTE — Telephone Encounter (Signed)
Audelia Acton the P.A. for Bradley Watts has been approved but I also received a P.A. for Diethylpropion is patient supposed to also be on this?

## 2021-11-28 NOTE — Telephone Encounter (Signed)
D/C

## 2021-12-13 ENCOUNTER — Other Ambulatory Visit: Payer: Self-pay | Admitting: Medical

## 2021-12-17 DIAGNOSIS — M9901 Segmental and somatic dysfunction of cervical region: Secondary | ICD-10-CM | POA: Diagnosis not present

## 2021-12-17 DIAGNOSIS — M5411 Radiculopathy, occipito-atlanto-axial region: Secondary | ICD-10-CM | POA: Diagnosis not present

## 2021-12-17 DIAGNOSIS — M9903 Segmental and somatic dysfunction of lumbar region: Secondary | ICD-10-CM | POA: Diagnosis not present

## 2021-12-18 ENCOUNTER — Other Ambulatory Visit: Payer: Self-pay | Admitting: Medical

## 2022-01-11 DIAGNOSIS — M9903 Segmental and somatic dysfunction of lumbar region: Secondary | ICD-10-CM | POA: Diagnosis not present

## 2022-01-11 DIAGNOSIS — M5411 Radiculopathy, occipito-atlanto-axial region: Secondary | ICD-10-CM | POA: Diagnosis not present

## 2022-01-11 DIAGNOSIS — M9901 Segmental and somatic dysfunction of cervical region: Secondary | ICD-10-CM | POA: Diagnosis not present

## 2022-01-18 DIAGNOSIS — M9901 Segmental and somatic dysfunction of cervical region: Secondary | ICD-10-CM | POA: Diagnosis not present

## 2022-01-18 DIAGNOSIS — M9903 Segmental and somatic dysfunction of lumbar region: Secondary | ICD-10-CM | POA: Diagnosis not present

## 2022-01-18 DIAGNOSIS — M5411 Radiculopathy, occipito-atlanto-axial region: Secondary | ICD-10-CM | POA: Diagnosis not present

## 2022-02-01 ENCOUNTER — Other Ambulatory Visit: Payer: Self-pay | Admitting: Medical

## 2022-02-01 DIAGNOSIS — M9901 Segmental and somatic dysfunction of cervical region: Secondary | ICD-10-CM | POA: Diagnosis not present

## 2022-02-01 DIAGNOSIS — E559 Vitamin D deficiency, unspecified: Secondary | ICD-10-CM

## 2022-02-01 DIAGNOSIS — M5411 Radiculopathy, occipito-atlanto-axial region: Secondary | ICD-10-CM | POA: Diagnosis not present

## 2022-02-01 DIAGNOSIS — M9903 Segmental and somatic dysfunction of lumbar region: Secondary | ICD-10-CM | POA: Diagnosis not present

## 2022-02-02 ENCOUNTER — Ambulatory Visit: Payer: BC Managed Care – PPO | Admitting: Family Medicine

## 2022-02-03 ENCOUNTER — Other Ambulatory Visit: Payer: BC Managed Care – PPO

## 2022-02-03 DIAGNOSIS — E559 Vitamin D deficiency, unspecified: Secondary | ICD-10-CM

## 2022-02-04 ENCOUNTER — Other Ambulatory Visit: Payer: Self-pay | Admitting: Medical

## 2022-02-04 LAB — VITAMIN D 25 HYDROXY (VIT D DEFICIENCY, FRACTURES): Vit D, 25-Hydroxy: 26.2 ng/mL — ABNORMAL LOW (ref 30.0–100.0)

## 2022-02-04 MED ORDER — VITAMIN D-3 125 MCG (5000 UT) PO TABS: 1.0000 | ORAL_TABLET | Freq: Every day | ORAL | 0 refills | Status: DC

## 2022-02-24 DIAGNOSIS — M9901 Segmental and somatic dysfunction of cervical region: Secondary | ICD-10-CM | POA: Diagnosis not present

## 2022-02-24 DIAGNOSIS — M5411 Radiculopathy, occipito-atlanto-axial region: Secondary | ICD-10-CM | POA: Diagnosis not present

## 2022-02-24 DIAGNOSIS — M9903 Segmental and somatic dysfunction of lumbar region: Secondary | ICD-10-CM | POA: Diagnosis not present

## 2022-03-10 IMAGING — DX DG KNEE COMPLETE 4+V*R*
4 series · 4 of 4 positions shown · non-contrast
Comparison: None.

CLINICAL DATA: Chronic right knee pain.

EXAM:
RIGHT KNEE - COMPLETE 4+ VIEW

[dg knee complete 4 views right (1 of 4)]
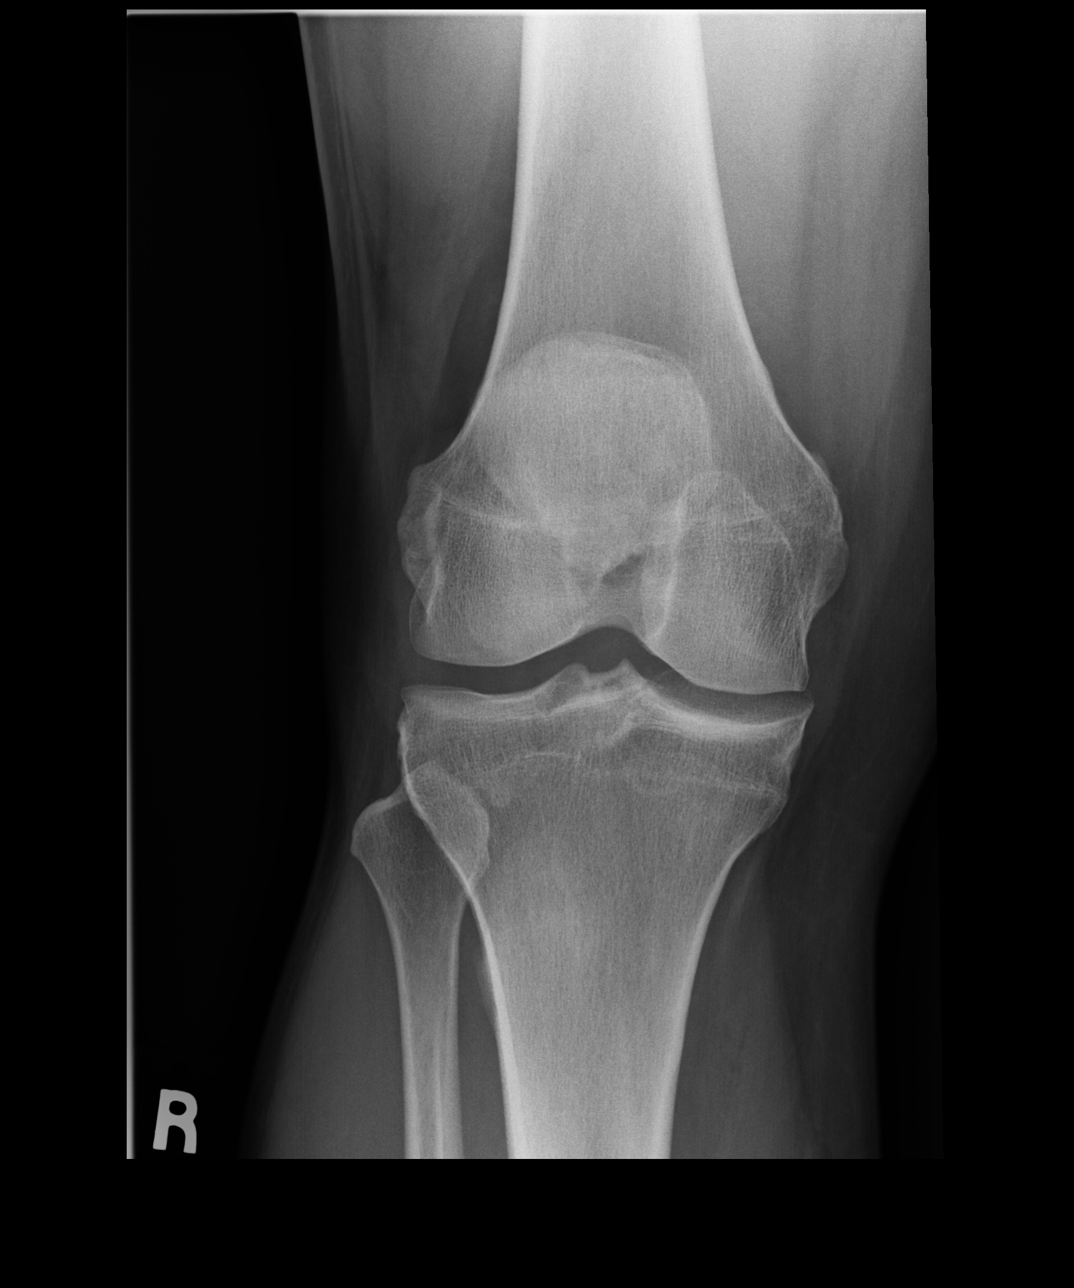

[dg knee complete 4 views right (2 of 4)]
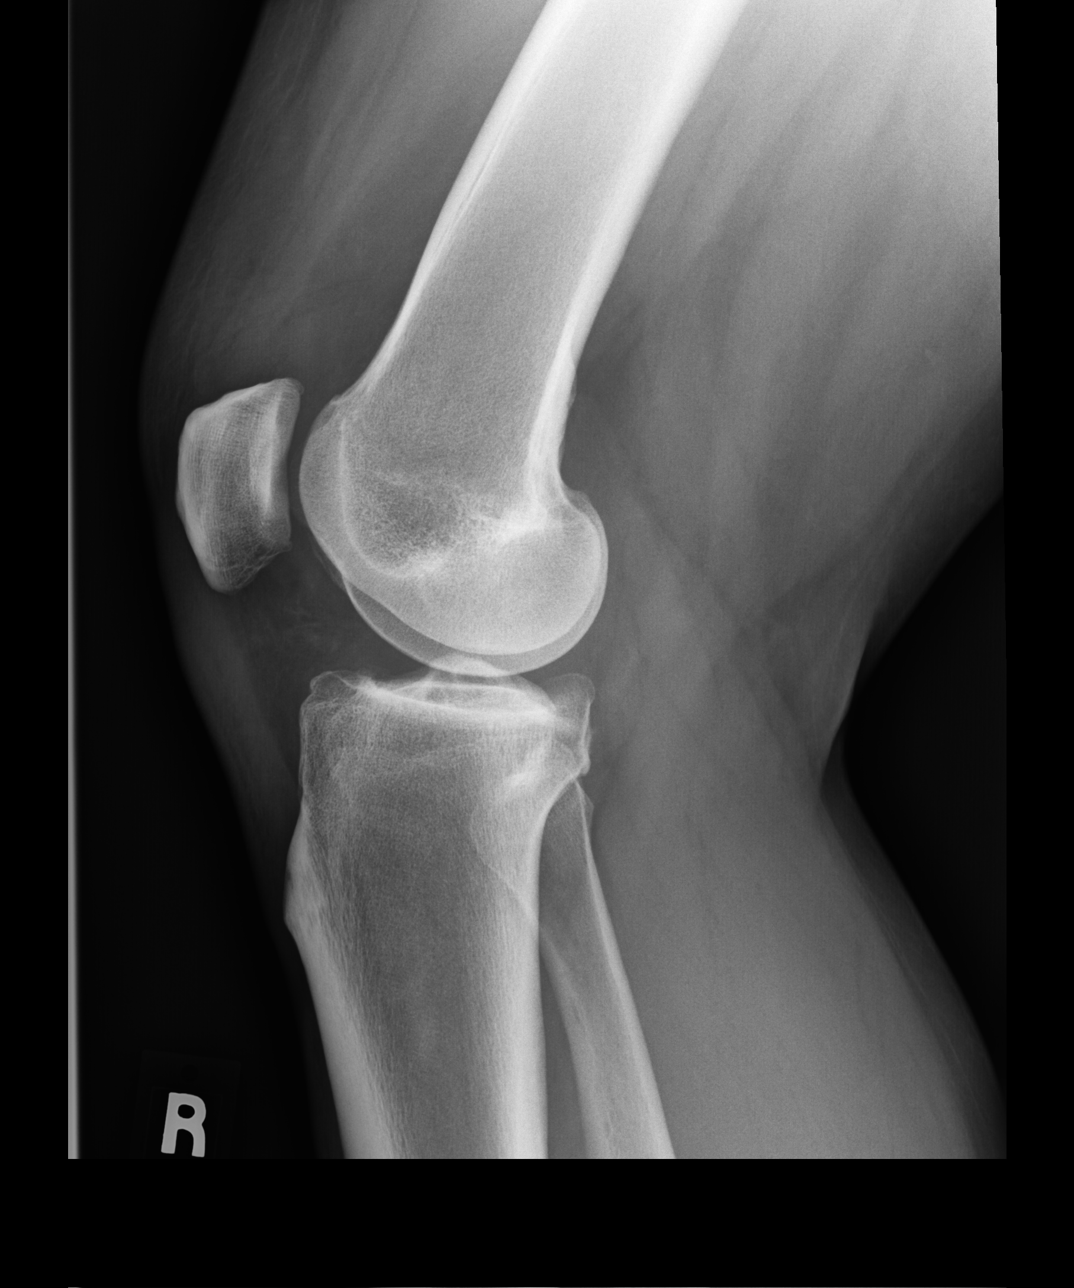

[dg knee complete 4 views right (3 of 4)]
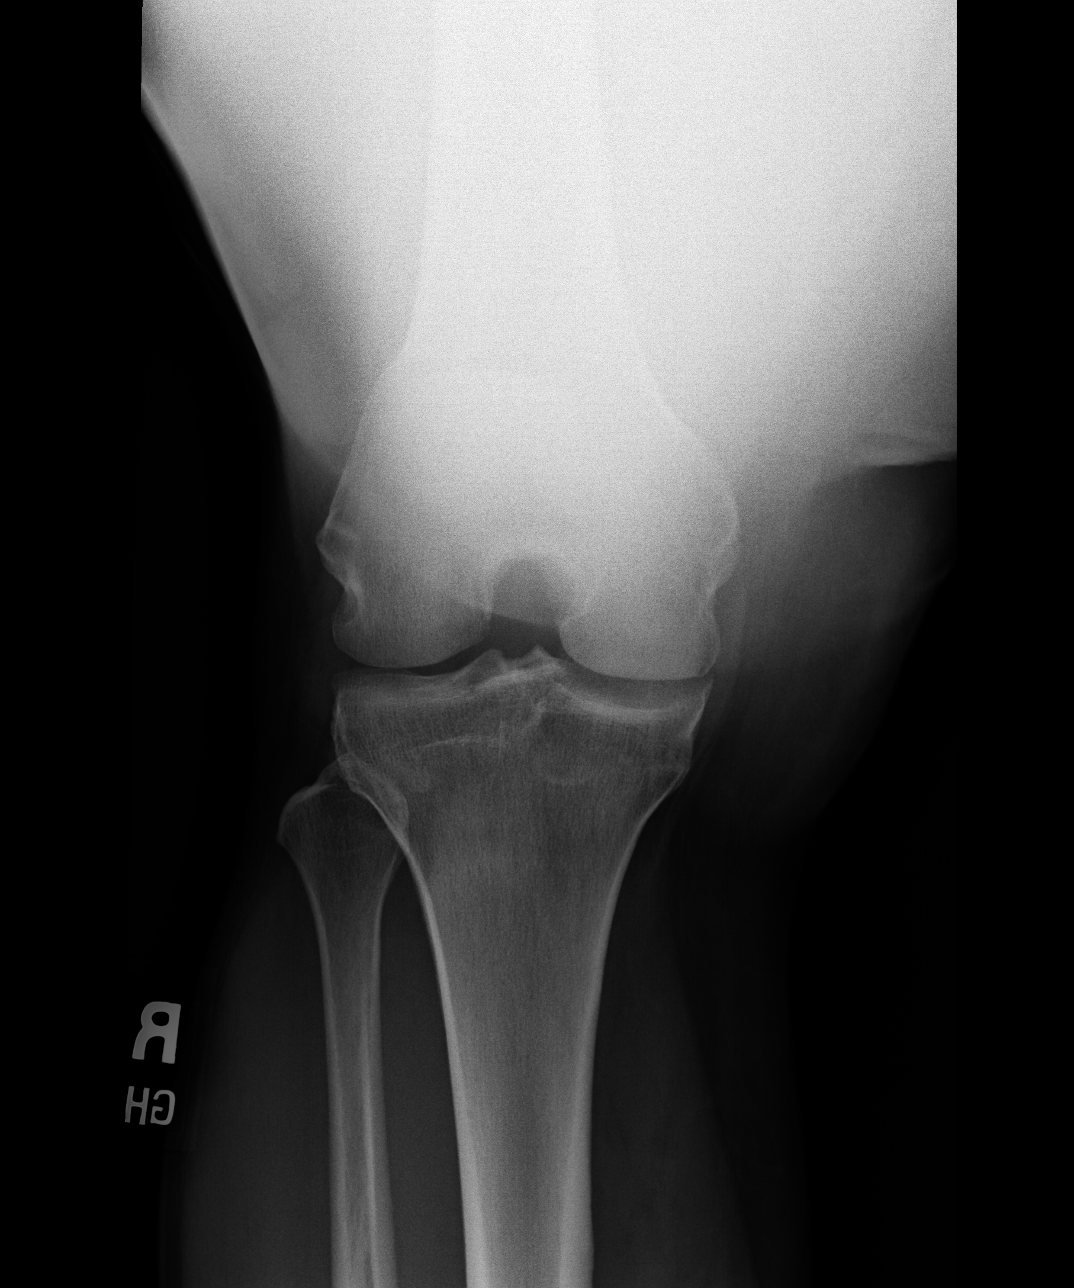

[dg knee complete 4 views right (4 of 4)]
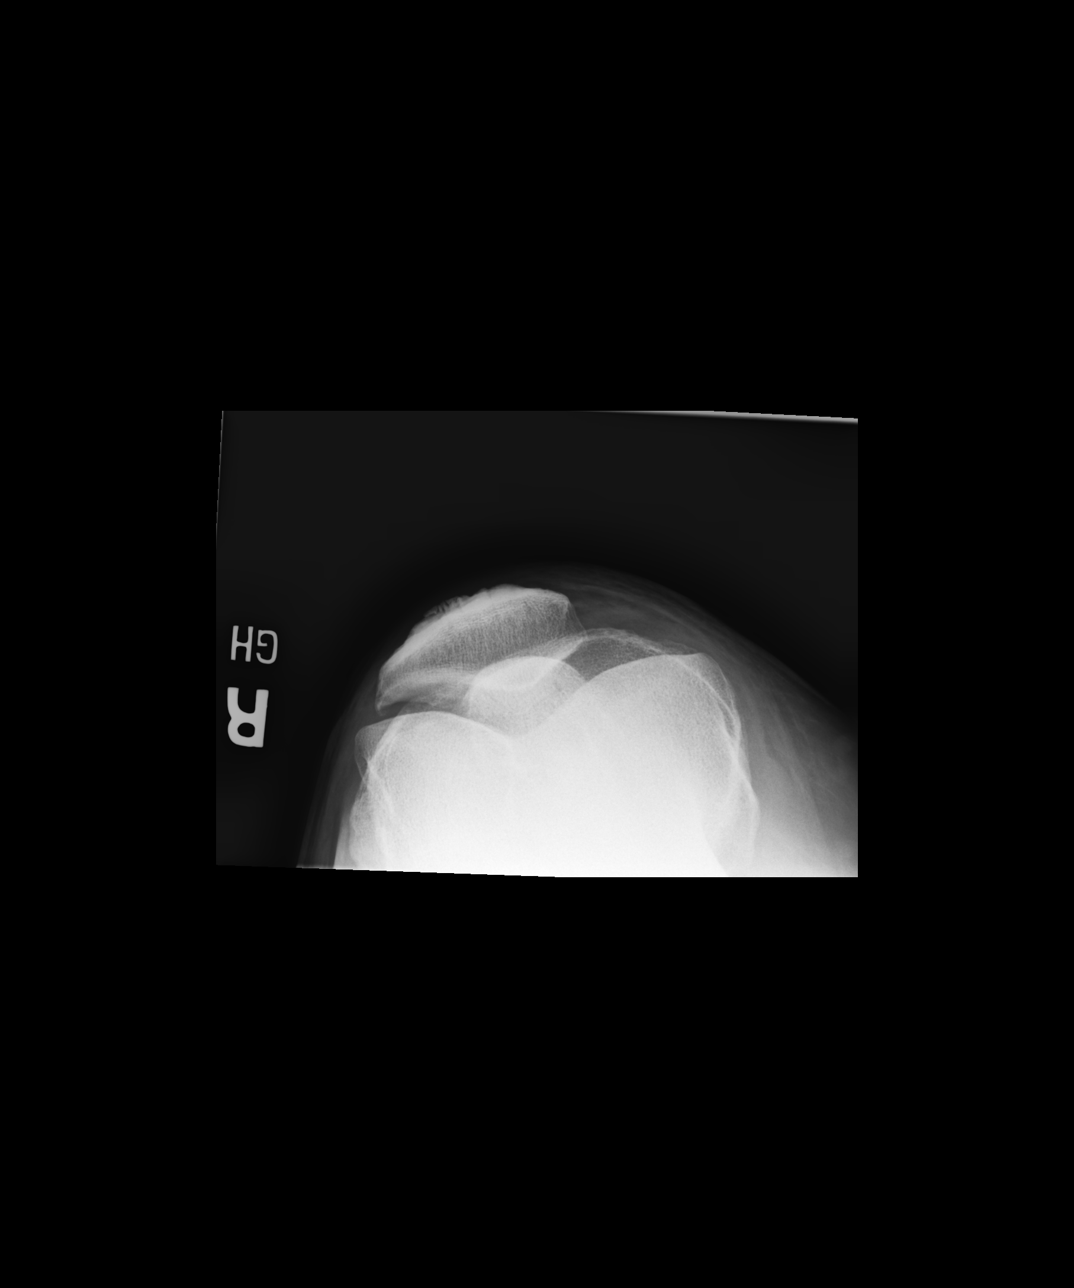

[4 of 4 positions shown; findings below may reference images not displayed]

FINDINGS: No evidence of fracture, dislocation, or joint effusion. No evidence
of arthropathy or other focal bone abnormality. Soft tissues are
unremarkable.
IMPRESSION: Negative.

## 2022-03-24 DIAGNOSIS — M9903 Segmental and somatic dysfunction of lumbar region: Secondary | ICD-10-CM | POA: Diagnosis not present

## 2022-03-24 DIAGNOSIS — M5411 Radiculopathy, occipito-atlanto-axial region: Secondary | ICD-10-CM | POA: Diagnosis not present

## 2022-03-24 DIAGNOSIS — M9901 Segmental and somatic dysfunction of cervical region: Secondary | ICD-10-CM | POA: Diagnosis not present

## 2022-06-02 ENCOUNTER — Encounter: Payer: Self-pay | Admitting: Internal Medicine

## 2022-06-17 ENCOUNTER — Ambulatory Visit: Payer: BC Managed Care – PPO | Admitting: Medical

## 2022-06-17 VITALS — BP 120/78 | HR 65 | Ht 72.0 in | Wt 266.6 lb

## 2022-06-17 DIAGNOSIS — Z6839 Body mass index (BMI) 39.0-39.9, adult: Secondary | ICD-10-CM

## 2022-06-17 DIAGNOSIS — R5383 Other fatigue: Secondary | ICD-10-CM | POA: Diagnosis not present

## 2022-06-17 DIAGNOSIS — R7989 Other specified abnormal findings of blood chemistry: Secondary | ICD-10-CM

## 2022-06-17 DIAGNOSIS — Z0000 Encounter for general adult medical examination without abnormal findings: Secondary | ICD-10-CM

## 2022-06-17 DIAGNOSIS — Z2821 Immunization not carried out because of patient refusal: Secondary | ICD-10-CM

## 2022-06-17 DIAGNOSIS — Z7185 Encounter for immunization safety counseling: Secondary | ICD-10-CM

## 2022-06-17 DIAGNOSIS — B001 Herpesviral vesicular dermatitis: Secondary | ICD-10-CM

## 2022-06-17 DIAGNOSIS — Z113 Encounter for screening for infections with a predominantly sexual mode of transmission: Secondary | ICD-10-CM

## 2022-06-17 DIAGNOSIS — R209 Unspecified disturbances of skin sensation: Secondary | ICD-10-CM

## 2022-06-17 DIAGNOSIS — E782 Mixed hyperlipidemia: Secondary | ICD-10-CM | POA: Diagnosis not present

## 2022-06-17 DIAGNOSIS — Z131 Encounter for screening for diabetes mellitus: Secondary | ICD-10-CM

## 2022-06-17 DIAGNOSIS — R140 Abdominal distension (gaseous): Secondary | ICD-10-CM | POA: Diagnosis not present

## 2022-06-17 DIAGNOSIS — E039 Hypothyroidism, unspecified: Secondary | ICD-10-CM

## 2022-06-17 DIAGNOSIS — Z85850 Personal history of malignant neoplasm of thyroid: Secondary | ICD-10-CM

## 2022-06-17 DIAGNOSIS — G8929 Other chronic pain: Secondary | ICD-10-CM | POA: Diagnosis not present

## 2022-06-17 DIAGNOSIS — Z8616 Personal history of COVID-19: Secondary | ICD-10-CM

## 2022-06-17 DIAGNOSIS — R6889 Other general symptoms and signs: Secondary | ICD-10-CM

## 2022-06-17 DIAGNOSIS — Z6836 Body mass index (BMI) 36.0-36.9, adult: Secondary | ICD-10-CM

## 2022-06-17 DIAGNOSIS — L989 Disorder of the skin and subcutaneous tissue, unspecified: Secondary | ICD-10-CM

## 2022-06-17 DIAGNOSIS — Z79899 Other long term (current) drug therapy: Secondary | ICD-10-CM

## 2022-06-17 DIAGNOSIS — E559 Vitamin D deficiency, unspecified: Secondary | ICD-10-CM

## 2022-06-17 DIAGNOSIS — K219 Gastro-esophageal reflux disease without esophagitis: Secondary | ICD-10-CM

## 2022-06-17 DIAGNOSIS — E890 Postprocedural hypothyroidism: Secondary | ICD-10-CM

## 2022-06-17 LAB — POCT URINALYSIS DIP (PROADVANTAGE DEVICE)
Bilirubin, UA: NEGATIVE
Blood, UA: NEGATIVE
Glucose, UA: NEGATIVE mg/dL
Ketones, POC UA: NEGATIVE mg/dL
Leukocytes, UA: NEGATIVE
Nitrite, UA: NEGATIVE
Protein Ur, POC: NEGATIVE mg/dL
Specific Gravity, Urine: 1.025
Urobilinogen, Ur: NEGATIVE
pH, UA: 6 (ref 5.0–8.0)

## 2022-06-17 NOTE — Progress Notes (Signed)
Subjective:   HPI  Bradley Watts is a 46 y.o. male who presents for Chief Complaint  Patient presents with   fasting cpe    Fasting cpe, scalp issues- bump in hair, abdominal pain sometimes. Would like Vitamin D, Thryoid and Testosterone checked. Declines flu shot today    Patient Care Team: Kasyn Stouffer, Leward Quan as PCP - General Reynold Bowen, MD as Consulting Physician (Endocrinology) Sees dentist Sees eye doctor  Concerns: Feels like feet turn blue when sitting at the toilet.  Concerned about this. Hands can look this way at times too.  No cracking open of fingertips or toes.   No foot pain  Has foot lesion chronic that he wants looked at again, similar lesion of left thumb.   Has scalp concern, possibly cyst in scalp.  Sometimes feels it when brushing hair.    Has dulll pain in left lower abdomen, but no associated bowel or bladder symptoms  He wants TST, thyroid and vit D checked  He sees endocrinology next month, compliant with thyroid medicine.  He is taking vitamin D 5000 units daily given history of vitamin D deficiency  GERD-taking Dexilant regularly.   Reviewed their medical, surgical, family, social, medication, and allergy history and updated chart as appropriate.  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 Jerene Canny   Recurrent cold sores    Wears glasses     Past Surgical History:  Procedure Laterality Date   CLOSED REDUCTION TIBIAL FRACTURE     left   THYROIDECTOMY  7/11    Family History  Problem Relation Age of Onset   Other Father        died in MVA   Diabetes Brother        type 1   Diabetes Daughter        type 1   Heart disease Maternal Grandfather 2   Cancer Paternal Grandfather        lung cancer   Hyperlipidemia Neg Hx    Hypertension Neg Hx     Stroke Neg Hx      Current Outpatient Medications:    Cholecalciferol (VITAMIN D-3) 125 MCG (5000 UT) TABS, Take 1 tablet by mouth daily., Disp: 90 tablet, Rfl: 0   dexlansoprazole (DEXILANT) 60 MG capsule, Take 1 capsule by mouth 2 (two) times daily., Disp: , Rfl:    levothyroxine (SYNTHROID, LEVOTHROID) 200 MCG tablet, Take 200 mcg by mouth daily., Disp: , Rfl:    valACYclovir (VALTREX) 1000 MG tablet, TAKE 1 TABLET BY MOUTH FOR SUPPRESSION DAILY, THEN 2 TABLETS FOR 1 DAY FOR FLARE-UPS, Disp: 270 tablet, Rfl: 1  No Known Allergies   Review of Systems Constitutional: -fever, -chills, -sweats, -unexpected weight change, -decreased appetite, -fatigue Allergy: -sneezing, -itching, -congestion Dermatology: + Skin concerns ENT: -runny nose, -ear pain, -sore throat, -hoarseness, -sinus pain, -teeth pain, - ringing in ears, -hearing loss, -nosebleeds Cardiology: -chest pain, -palpitations, -swelling, -difficulty breathing when lying flat, -waking up short of breath Respiratory: -cough, -shortness of breath, -difficulty breathing with exercise or exertion, -wheezing, -coughing up blood Gastroenterology: +abdominal pain, -nausea, -vomiting, -diarrhea, -constipation, -blood in stool, -changes in bowel movement, -difficulty swallowing or eating Hematology: -bleeding, -bruising  Musculoskeletal: -joint aches, -muscle aches, -joint swelling, -back pain, -neck pain, -cramping, -changes in gait Ophthalmology: denies vision changes,  eye redness, itching, discharge Urology: -burning with urination, -difficulty urinating, -blood in urine, -urinary frequency, -urgency, -incontinence Neurology: -headache, -weakness, -tingling, -numbness, -memory loss, -falls, -dizziness Psychology: -depressed mood, -agitation, -sleep problems Male GU: no testicular mass, pain, no lymph nodes swollen, no swelling, no rash.     06/17/2022    9:51 AM 11/12/2021   10:26 AM 08/19/2021    3:37 PM 06/16/2021    9:50 AM  06/16/2021    8:35 AM  Depression screen PHQ 2/9  Decreased Interest 0 0 0 0 0  Down, Depressed, Hopeless 0 0 0 0 0  PHQ - 2 Score 0 0 0 0 0  Altered sleeping    0   Tired, decreased energy    1   Change in appetite    1   Feeling bad or failure about yourself     0   Trouble concentrating    0   Moving slowly or fidgety/restless    0   Suicidal thoughts    0   PHQ-9 Score    2         Objective:  BP 120/78   Pulse 65   Ht 6' (1.829 m)   Wt 266 lb 9.6 oz (120.9 kg)   BMI 36.16 kg/m   Wt Readings from Last 3 Encounters:  06/17/22 266 lb 9.6 oz (120.9 kg)  11/12/21 254 lb (115.2 kg)  08/19/21 265 lb 3.2 oz (120.3 kg)   General appearance: alert, no distress, WD/WN, Caucasian male Skin: Left thumb just before the thumbnail with a raised 3 to 4 mm diameter soft tissue swelling suggestive of suggestive of thicker gel underneath, left third toe distally just before the nail with a raised pink/purple 4 mm raised lesion that somewhat dense likely with some fluid within the sac underneath the skin, left parietal superior scalp within the hair line with a irregular salmon-colored 1 cm x 4 mm lesion HEENT: normocephalic, conjunctiva/corneas normal, sclerae anicteric, PERRLA, EOMi, nares patent, no discharge or erythema, pharynx normal Oral cavity: MMM, tongue normal, teeth normal Neck: supple, no lymphadenopathy, no thyromegaly, no masses, normal ROM, no bruits Chest: non tender, normal shape and expansion Heart: RRR, normal S1, S2, no murmurs Lungs: CTA bilaterally, no wheezes, rhonchi, or rales Abdomen: +bs, soft, mild LLQ tendnerss, otherwise non tender, non distended, no masses, no hepatomegaly, no splenomegaly, no bruits Back: non tender, normal ROM, no scoliosis Musculoskeletal: upper extremities non tender, no obvious deformity, normal ROM throughout, lower extremities non tender, no obvious deformity, normal ROM throughout Extremities: no edema, no cyanosis, no clubbing Pulses:  2+ symmetric, upper and lower extremities, normal cap refill Neurological: alert, oriented x 3, CN2-12 intact, strength normal upper extremities and lower extremities, sensation normal throughout, DTRs 2+ throughout, no cerebellar signs, gait normal Psychiatric: normal affect, behavior normal, pleasant  GU: normal male external genitalia,circumcised, nontender, no masses, no hernia, no lymphadenopathy Rectal: deferred  Assessment and Plan :   Encounter Diagnoses  Name Primary?   Routine general medical examination at a health care facility Yes   Mixed hyperlipidemia    Hypothyroidism, unspecified type    BMI 39.0-39.9,adult    Gastroesophageal reflux disease, unspecified whether esophagitis present    High risk medication use    Vaccine counseling    Screening for diabetes mellitus    Screen for STD (sexually transmitted disease)    S/P thyroidectomy    Recurrent cold sores    Class 2 severe obesity with serious comorbidity and body  mass index (BMI) of 36.0 to 36.9 in adult, unspecified obesity type ([place])    History of thyroid cancer    History of COVID-19    Influenza vaccination declined    Skin lesion    Cold feeling    Bilateral cold feet    Vitamin D deficiency    Low testosterone     This visit was a preventative care visit, also known as wellness visit or routine physical.   Topics typically include healthy lifestyle, diet, exercise, preventative care, vaccinations, sick and well care, proper use of emergency dept and after hours care, as well as other concerns.     Recommendations: Continue to return yearly for your annual wellness and preventative care visits.  This gives [place] a chance to discuss healthy lifestyle, exercise, vaccinations, review your chart record, and perform screenings where appropriate.  I recommend you see your eye doctor yearly for routine vision care.  I recommend you see your dentist yearly for routine dental care including hygiene visits twice  yearly.   Vaccination recommendations were reviewed Immunization History  Administered Date(s) Administered   Tdap 04/23/2013   Declines flu shot   Screening for cancer: Colon cancer screening: We will request copy of recent colonoscopy from Dr. Collene Mares  Testicular cancer screening You should do a monthly self testicular exam if you are between 47-25 years old  We discussed PSA, prostate exam, and prostate cancer screening risks/benefits.     Skin cancer screening: Check your skin regularly for new changes, growing lesions, or other lesions of concern Come in for evaluation if you have skin lesions of concern.  Lung cancer screening: If you have a greater than 20 pack year history of tobacco use, then you may qualify for lung cancer screening with a chest CT scan.   Please call your insurance company to inquire about coverage for this test.  We currently don't have screenings for other cancers besides breast, cervical, colon, and lung cancers.  If you have a strong family history of cancer or have other cancer screening concerns, please let me know.    Bone health: Get at least 150 minutes of aerobic exercise weekly Get weight bearing exercise at least once weekly Bone density test:  A bone density test is an imaging test that uses a type of X-ray to measure the amount of calcium and other minerals in your bones. The test may be used to diagnose or screen you for a condition that causes weak or thin bones (osteoporosis), predict your risk for a broken bone (fracture), or determine how well your osteoporosis treatment is working. The bone density test is recommended for females 61 and older, or females or males <62 if certain risk factors such as thyroid disease, long term use of steroids such as for asthma or rheumatological issues, vitamin D deficiency, estrogen deficiency, family history of osteoporosis, self or family history of fragility fracture in first degree  relative.    Heart health: Get at least 150 minutes of aerobic exercise weekly Limit alcohol It is important to maintain a healthy blood pressure and healthy cholesterol numbers  Heart disease screening: Screening for heart disease includes screening for blood pressure, fasting lipids, glucose/diabetes screening, BMI height to weight ratio, reviewed of smoking status, physical activity, and diet.    Goals include blood pressure 120/80 or less, maintaining a healthy lipid/cholesterol profile, preventing diabetes or keeping diabetes numbers under good control, not smoking or using tobacco products, exercising most days per week or at  least 150 minutes per week of exercise, and eating healthy variety of fruits and vegetables, healthy oils, and avoiding unhealthy food choices like fried food, fast food, high sugar and high cholesterol foods.    Other tests may possibly include EKG test, CT coronary calcium score, echocardiogram, exercise treadmill stress test.   Medical care options: I recommend you continue to seek care here first for routine care.  We try really hard to have available appointments Monday through Friday daytime hours for sick visits, acute visits, and physicals.  Urgent care should be used for after hours and weekends for significant issues that cannot wait till the next day.  The emergency department should be used for significant potentially life-threatening emergencies.  The emergency department is expensive, can often have long wait times for less significant concerns, so try to utilize primary care, urgent care, or telemedicine when possible to avoid unnecessary trips to the emergency department.  Virtual visits and telemedicine have been introduced since the pandemic started in 2020, and can be convenient ways to receive medical care.  We offer virtual appointments as well to assist you in a variety of options to seek medical care.   Advanced Directives: I recommend you  consider completing a [place] and Living Will.   These documents respect your wishes and help alleviate burdens on your loved ones if you were to become terminally ill or be in a position to need those documents enforced.    You can complete Advanced Directives yourself, have them notarized, then have copies made for our office, for you and for anybody you feel should have them in safe keeping.  Or, you can have an attorney prepare these documents.   If you haven't updated your Last Will and Testament in a while, it may be worthwhile having an attorney prepare these documents together and save on some costs.       Separate significant issues discussed: Given his multiple skin concerns I will put a referral in for dermatology  Left lower quadrant abdominal pain unclear etiology.  No obvious abnormality on exam, no obvious hernia, no associated bowel or bladder concerns.  Follow-up pending labs, consider abdominal scan if symptoms persist.  We will get a copy of his colonoscopy done within the past year by gastroenterology  Hypothyroidism-follow-up with endocrinology soon as planned   Obesity-continue to work on efforts to lose weight through healthy diet and exercise  Vitamin D deficiency-on supplement, updated labs today  Recurrent cold sores-continue Valtrex  History of hyperlipidemia-updated labs today, not on medication  GERD-continue Dexilant, request recent GI records  History of cold feet-we discussed possible causes.  There could be some Raynaud's phenomenon.  We discussed possible ABIs in the legs but he declines that for now.  We discussed some labs today and consider Raynaud's precautions.    Alecxis was seen today for fasting cpe.  Diagnoses and all orders for this visit:  Routine general medical examination at a health care facility -     Comprehensive metabolic panel -     CBC -     Lipid panel -     POCT Urinalysis DIP (Proadvantage Device) -      Hemoglobin A1c -     Testosterone -     Vitamin D, 25-hydroxy -     TSH + free T4 -     Testosterone -     Ambulatory referral to Dermatology -     Sedimentation rate -  ANA  Mixed hyperlipidemia -     Lipid panel  Hypothyroidism, unspecified type -     TSH + free T4  BMI 39.0-39.9,adult  Gastroesophageal reflux disease, unspecified whether esophagitis present  High risk medication use  Vaccine counseling  Screening for diabetes mellitus -     Hemoglobin A1c  Screen for STD (sexually transmitted disease)  S/P thyroidectomy  Recurrent cold sores  Class 2 severe obesity with serious comorbidity and body mass index (BMI) of 36.0 to 36.9 in adult, unspecified obesity type ([place])  History of thyroid cancer  History of COVID-19  Influenza vaccination declined  Skin lesion -     Ambulatory referral to Dermatology -     Sedimentation rate -     ANA  Cold feeling -     Sedimentation rate -     ANA  Bilateral cold feet -     Sedimentation rate -     ANA  Vitamin D deficiency -     Vitamin D, 25-hydroxy  Low testosterone -     Testosterone   Follow-up pending labs, yearly for physical

## 2022-06-19 LAB — CBC
Hematocrit: 49.4 % (ref 37.5–51.0)
Hemoglobin: 16.8 g/dL (ref 13.0–17.7)
MCH: 31.5 pg (ref 26.6–33.0)
MCHC: 34 g/dL (ref 31.5–35.7)
MCV: 93 fL (ref 79–97)
Platelets: 250 10*3/uL (ref 150–450)
RBC: 5.34 x10E6/uL (ref 4.14–5.80)
RDW: 12.8 % (ref 11.6–15.4)
WBC: 5.5 10*3/uL (ref 3.4–10.8)

## 2022-06-19 LAB — COMPREHENSIVE METABOLIC PANEL
ALT: 30 IU/L (ref 0–44)
AST: 20 IU/L (ref 0–40)
Albumin/Globulin Ratio: 2.2 (ref 1.2–2.2)
Albumin: 4.8 g/dL (ref 4.1–5.1)
Alkaline Phosphatase: 68 IU/L (ref 44–121)
BUN/Creatinine Ratio: 23 — ABNORMAL HIGH (ref 9–20)
BUN: 21 mg/dL (ref 6–24)
Bilirubin Total: 0.3 mg/dL (ref 0.0–1.2)
CO2: 24 mmol/L (ref 20–29)
Calcium: 9.5 mg/dL (ref 8.7–10.2)
Chloride: 102 mmol/L (ref 96–106)
Creatinine, Ser: 0.9 mg/dL (ref 0.76–1.27)
Globulin, Total: 2.2 g/dL (ref 1.5–4.5)
Glucose: 100 mg/dL — ABNORMAL HIGH (ref 70–99)
Potassium: 4.9 mmol/L (ref 3.5–5.2)
Sodium: 141 mmol/L (ref 134–144)
Total Protein: 7 g/dL (ref 6.0–8.5)
eGFR: 106 mL/min/{1.73_m2} (ref >59)

## 2022-06-19 LAB — VITAMIN D 25 HYDROXY (VIT D DEFICIENCY, FRACTURES): Vit D, 25-Hydroxy: 33.8 ng/mL (ref 30.0–100.0)

## 2022-06-19 LAB — ANA: Anti Nuclear Antibody (ANA): NEGATIVE

## 2022-06-19 LAB — TSH+FREE T4
Free T4: 1.94 ng/dL — ABNORMAL HIGH (ref 0.82–1.77)
TSH: 0.538 u[IU]/mL (ref 0.450–4.500)

## 2022-06-19 LAB — LIPID PANEL
Chol/HDL Ratio: 5.7 ratio — ABNORMAL HIGH (ref 0.0–5.0)
Cholesterol, Total: 210 mg/dL — ABNORMAL HIGH (ref 100–199)
HDL: 37 mg/dL — ABNORMAL LOW (ref >39)
LDL Chol Calc (NIH): 147 mg/dL — ABNORMAL HIGH (ref 0–99)
Triglycerides: 145 mg/dL (ref 0–149)
VLDL Cholesterol Cal: 26 mg/dL (ref 5–40)

## 2022-06-19 LAB — SEDIMENTATION RATE: Sed Rate: 2 mm/hr (ref 0–15)

## 2022-06-19 LAB — HEMOGLOBIN A1C
Est. average glucose Bld gHb Est-mCnc: 114 mg/dL
Hgb A1c MFr Bld: 5.6 % (ref 4.8–5.6)

## 2022-06-19 LAB — TESTOSTERONE: Testosterone: 300 ng/dL (ref 264–916)

## 2022-06-21 DIAGNOSIS — Z1331 Encounter for screening for depression: Secondary | ICD-10-CM | POA: Diagnosis not present

## 2022-06-21 DIAGNOSIS — Z1389 Encounter for screening for other disorder: Secondary | ICD-10-CM | POA: Diagnosis not present

## 2022-06-21 DIAGNOSIS — C73 Malignant neoplasm of thyroid gland: Secondary | ICD-10-CM | POA: Diagnosis not present

## 2022-07-06 ENCOUNTER — Encounter: Payer: Self-pay | Admitting: Internal Medicine

## 2022-07-14 ENCOUNTER — Encounter: Payer: Self-pay | Admitting: Internal Medicine

## 2022-07-19 DIAGNOSIS — L739 Follicular disorder, unspecified: Secondary | ICD-10-CM | POA: Diagnosis not present

## 2022-07-19 DIAGNOSIS — M67479 Ganglion, unspecified ankle and foot: Secondary | ICD-10-CM | POA: Diagnosis not present

## 2022-08-04 ENCOUNTER — Encounter: Payer: Self-pay | Admitting: Internal Medicine

## 2022-08-09 DIAGNOSIS — L739 Follicular disorder, unspecified: Secondary | ICD-10-CM | POA: Diagnosis not present

## 2022-08-09 DIAGNOSIS — L821 Other seborrheic keratosis: Secondary | ICD-10-CM | POA: Diagnosis not present

## 2022-08-30 DIAGNOSIS — M9903 Segmental and somatic dysfunction of lumbar region: Secondary | ICD-10-CM | POA: Diagnosis not present

## 2022-08-30 DIAGNOSIS — M9901 Segmental and somatic dysfunction of cervical region: Secondary | ICD-10-CM | POA: Diagnosis not present

## 2022-08-30 DIAGNOSIS — M5411 Radiculopathy, occipito-atlanto-axial region: Secondary | ICD-10-CM | POA: Diagnosis not present

## 2022-12-18 ENCOUNTER — Other Ambulatory Visit: Payer: Self-pay | Admitting: Medical

## 2023-06-21 ENCOUNTER — Encounter: Payer: Self-pay | Admitting: Medical

## 2023-06-21 ENCOUNTER — Ambulatory Visit: Payer: BC Managed Care – PPO | Admitting: Medical

## 2023-06-21 VITALS — BP 120/72 | HR 66 | Ht 71.0 in | Wt 290.4 lb

## 2023-06-21 DIAGNOSIS — M25561 Pain in right knee: Secondary | ICD-10-CM

## 2023-06-21 DIAGNOSIS — Z7185 Encounter for immunization safety counseling: Secondary | ICD-10-CM

## 2023-06-21 DIAGNOSIS — Z131 Encounter for screening for diabetes mellitus: Secondary | ICD-10-CM | POA: Diagnosis not present

## 2023-06-21 DIAGNOSIS — Z0000 Encounter for general adult medical examination without abnormal findings: Secondary | ICD-10-CM | POA: Diagnosis not present

## 2023-06-21 DIAGNOSIS — Z113 Encounter for screening for infections with a predominantly sexual mode of transmission: Secondary | ICD-10-CM | POA: Diagnosis not present

## 2023-06-21 DIAGNOSIS — Z85850 Personal history of malignant neoplasm of thyroid: Secondary | ICD-10-CM

## 2023-06-21 DIAGNOSIS — K219 Gastro-esophageal reflux disease without esophagitis: Secondary | ICD-10-CM | POA: Diagnosis not present

## 2023-06-21 DIAGNOSIS — Z23 Encounter for immunization: Secondary | ICD-10-CM | POA: Diagnosis not present

## 2023-06-21 DIAGNOSIS — E782 Mixed hyperlipidemia: Secondary | ICD-10-CM | POA: Diagnosis not present

## 2023-06-21 DIAGNOSIS — G8929 Other chronic pain: Secondary | ICD-10-CM

## 2023-06-21 DIAGNOSIS — E039 Hypothyroidism, unspecified: Secondary | ICD-10-CM

## 2023-06-21 DIAGNOSIS — E890 Postprocedural hypothyroidism: Secondary | ICD-10-CM

## 2023-06-21 DIAGNOSIS — Z125 Encounter for screening for malignant neoplasm of prostate: Secondary | ICD-10-CM

## 2023-06-21 DIAGNOSIS — E559 Vitamin D deficiency, unspecified: Secondary | ICD-10-CM | POA: Diagnosis not present

## 2023-06-21 NOTE — Progress Notes (Signed)
Subjective:   HPI  Bradley Watts is a 47 y.o. male who presents for Chief Complaint  Patient presents with   Annual Exam    Fasting annual exam, will leave [facility] on the way out. Right knee is bothering him again. Bowels have been bothering him again, started taking probiotics and seems to have helped. Would like to have full STD exam. At what age do you check PSA? Labs for endo TSH and globulin (thyroid tissue?), forwarded to Dr. Evlyn Kanner.     Patient Care Team: Adiana Smelcer, Cleda Mccreedy as PCP - Jill Side, MD as Consulting Physician (Endocrinology) Sees dentist Sees eye doctor Dr. Kathi Der, GI  Concerns: He has ongoing right knee pains.  It usually hurts if he has been sitting for a while and gets up out of the car.  He does not have pain going up or down stairs.  Does not get swelling.  Using just longer periods of sitting aggravates the knee.  He also gets ongoing pains in the lower abdomen on the left last year but after starting Kombucha or probiotic this seemed to help  colon 09/2021  He sees endocrinology next month, compliant with thyroid medicine.  GERD-taking Dexilant regularly.   Reviewed their medical, surgical, family, social, medication, and allergy history and updated chart as appropriate.  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 Fanny Skates   Recurrent cold sores    Wears glasses     Past Surgical History:  Procedure Laterality Date   CLOSED REDUCTION TIBIAL FRACTURE     left   THYROIDECTOMY  03/27/2010   UPPER GI ENDOSCOPY      Family History  Problem Relation Age of Onset   Other Father        died in MVA   Diabetes Brother        type 1   Diabetes Daughter        type 1   Heart disease Maternal Grandfather 52   Cancer Paternal Grandfather         lung cancer   Hyperlipidemia Neg Hx    Hypertension Neg Hx    Stroke Neg Hx      Current Outpatient Medications:    dexlansoprazole (DEXILANT) 60 MG capsule, Take 1 capsule by mouth 2 (two) times daily., Disp: , Rfl:    levothyroxine (SYNTHROID, LEVOTHROID) 200 MCG tablet, Take 200 mcg by mouth daily., Disp: , Rfl:    NONFORMULARY OR COMPOUNDED ITEM, , Disp: , Rfl:    valACYclovir (VALTREX) 1000 MG tablet, TAKE 1 TABLET BY MOUTH FOR SUPPRESSION DAILY, THEN 2 TABLETS FOR 1 DAYS FOR FLARE UPS, Disp: 270 tablet, Rfl: 0  No Known Allergies   Review of Systems  Constitutional:  Negative for chills, fever, malaise/fatigue and weight loss.  HENT:  Negative for congestion, ear pain, hearing loss, sore throat and tinnitus.   Eyes:  Negative for blurred vision, pain and redness.  Respiratory:  Negative for cough, hemoptysis and shortness of breath.   Cardiovascular:  Negative for chest pain, palpitations, orthopnea, claudication and leg swelling.  Gastrointestinal:  Negative for abdominal pain, blood in stool, constipation, diarrhea, nausea and vomiting.  Genitourinary:  Negative for dysuria, flank pain, frequency, hematuria and urgency.  Musculoskeletal:  Positive for joint pain. Negative for falls and myalgias.  Skin:  Negative for itching and rash.  Neurological:  Negative for dizziness, tingling, speech change, weakness and headaches.  Endo/Heme/Allergies:  Negative for polydipsia. Does not bruise/bleed easily.  Psychiatric/Behavioral:  Negative for depression and memory loss. The patient is not nervous/anxious and does not have insomnia.         06/21/2023    9:34 AM 06/17/2022    9:51 AM 11/12/2021   10:26 AM 08/19/2021    3:37 PM 06/16/2021    9:50 AM  Depression screen PHQ 2/9  Decreased Interest 0 0 0 0 0  Down, Depressed, Hopeless 0 0 0 0 0  PHQ - 2 Score 0 0 0 0 0  Altered sleeping     0  Tired, decreased energy     1  Change in appetite     1  Feeling bad or failure about  yourself      0  Trouble concentrating     0  Moving slowly or fidgety/restless     0  Suicidal thoughts     0  PHQ-9 Score     2        Objective:  BP 120/72   Pulse 66   Ht 5\' 11"  (1.803 m)   Wt 290 lb 6.4 oz (131.7 kg)   BMI 40.50 kg/m   Wt Readings from Last 3 Encounters:  06/21/23 290 lb 6.4 oz (131.7 kg)  06/17/22 266 lb 9.6 oz (120.9 kg)  11/12/21 254 lb (115.2 kg)   General appearance: alert, no distress, WD/WN, Caucasian male Skin: unremarkable HEENT: normocephalic, conjunctiva/corneas normal, sclerae anicteric, PERRLA, EOMi, nares patent, no discharge or erythema, pharynx normal Oral cavity: MMM, tongue normal, teeth normal Neck: supple, no lymphadenopathy, no thyromegaly, no masses, normal ROM, no bruits Chest: non tender, normal shape and expansion Heart: RRR, normal S1, S2, no murmurs Lungs: CTA bilaterally, no wheezes, rhonchi, or rales Abdomen: +bs, soft, non tender, non distended, no masses, no hepatomegaly, no splenomegaly, no bruits Back: non tender, normal ROM, no scoliosis Musculoskeletal: upper extremities non tender, no obvious deformity, normal ROM throughout, lower extremities non tender, no obvious deformity, normal ROM throughout Extremities: no edema, no cyanosis, no clubbing Pulses: 2+ symmetric, upper and lower extremities, normal cap refill Neurological: alert, oriented x 3, CN2-12 intact, strength normal upper extremities and lower extremities, sensation normal throughout, DTRs 2+ throughout, no cerebellar signs, gait normal Psychiatric: normal affect, behavior normal, pleasant  GU/rectal - deferred    Assessment and Plan :   Encounter Diagnoses  Name Primary?   Encounter for health maintenance examination in adult Yes   Need for Tdap vaccination    Screen for STD (sexually transmitted disease)    Gastroesophageal reflux disease, unspecified whether esophagitis present    History of thyroid cancer    Hypothyroidism, unspecified type     Screening for diabetes mellitus    Vaccine counseling    S/P thyroidectomy    Mixed hyperlipidemia    Screening for prostate cancer     This visit was a preventative care visit, also known as wellness visit or routine physical.   Topics typically include healthy lifestyle, diet, exercise, preventative care, vaccinations, sick and well care, proper use of emergency dept and after hours care, as well as other concerns.     Recommendations: Continue to return yearly for your annual wellness and preventative care visits.  This gives [place] a chance to discuss healthy lifestyle, exercise, vaccinations, review your chart record, and perform screenings where appropriate.  I recommend you see your eye doctor yearly for routine vision care.  I recommend you see your dentist yearly for routine dental care including hygiene visits twice yearly.   Vaccination recommendations were reviewed Immunization History  Administered Date(s) Administered   Tdap 04/23/2013   Declines flu shot  Counseled on the Tdap (tetanus, diptheria, and acellular pertussis) vaccine.  Vaccine information sheet given. Tdap vaccine given after consent obtained.   Screening for cancer: Colon cancer screening: Reviewed his 09/2021 colonoscopy  Testicular cancer screening You should do a monthly self testicular exam if you are between 23-67 years old  We discussed PSA, prostate exam, and prostate cancer screening risks/benefits.     Skin cancer screening: Check your skin regularly for new changes, growing lesions, or other lesions of concern Come in for evaluation if you have skin lesions of concern.  Lung cancer screening: If you have a greater than 20 pack year history of tobacco use, then you may qualify for lung cancer screening with a chest CT scan.   Please call your insurance company to inquire about coverage for this test.  We currently don't have screenings for other cancers besides breast, cervical, colon, and  lung cancers.  If you have a strong family history of cancer or have other cancer screening concerns, please let me know.    Bone health: Get at least 150 minutes of aerobic exercise weekly Get weight bearing exercise at least once weekly Bone density test:  A bone density test is an imaging test that uses a type of X-ray to measure the amount of calcium and other minerals in your bones. The test may be used to diagnose or screen you for a condition that causes weak or thin bones (osteoporosis), predict your risk for a broken bone (fracture), or determine how well your osteoporosis treatment is working. The bone density test is recommended for females 65 and older, or females or males <65 if certain risk factors such as thyroid disease, long term use of steroids such as for asthma or rheumatological issues, vitamin D deficiency, estrogen deficiency, family history of osteoporosis, self or family history of fragility fracture in first degree relative.    Heart health: Get at least 150 minutes of aerobic exercise weekly Limit alcohol It is important to maintain a healthy blood pressure and healthy cholesterol numbers  Heart disease screening: Screening for heart disease includes screening for blood pressure, fasting lipids, glucose/diabetes screening, BMI height to weight ratio, reviewed of smoking status, physical activity, and diet.    Goals include blood pressure 120/80 or less, maintaining a healthy lipid/cholesterol profile, preventing diabetes or keeping diabetes numbers under good control, not smoking or using tobacco products, exercising most days per week or at least 150 minutes per week of exercise, and eating healthy variety of fruits and vegetables, healthy oils, and avoiding unhealthy food choices like fried food, fast food, high sugar and high cholesterol foods.    Other tests may possibly include EKG test, CT coronary calcium score, echocardiogram, exercise treadmill stress test.    Medical care options: I recommend you continue to seek care here first for routine care.  We try really hard to have available appointments Monday through Friday daytime hours for sick visits, acute visits, and physicals.  Urgent care should be used for after hours and weekends for significant issues that cannot wait till the next day.  The emergency department should be used for significant potentially life-threatening emergencies.  The emergency department is expensive, can often  have long wait times for less significant concerns, so try to utilize primary care, urgent care, or telemedicine when possible to avoid unnecessary trips to the emergency department.  Virtual visits and telemedicine have been introduced since the pandemic started in 2020, and can be convenient ways to receive medical care.  We offer virtual appointments as well to assist you in a variety of options to seek medical care.   Advanced Directives: I recommend you consider completing a Health Care Power of Attorney and Living Will.   These documents respect your wishes and help alleviate burdens on your loved ones if you were to become terminally ill or be in a position to need those documents enforced.    You can complete Advanced Directives yourself, have them notarized, then have copies made for our office, for you and for anybody you feel should have them in safe keeping.  Or, you can have an attorney prepare these documents.   If you haven't updated your Last Will and Testament in a while, it may be worthwhile having an attorney prepare these documents together and save on some costs.       Separate significant issues discussed: Intermittent lower abdominal pain, reviewed colonoscopy last year.  Seems to have improvement on probiotic.  Discussed potential for diverticulitis and symptoms that would prompt recheck  Chronic knee pain - refer to sports medicine  Hypothyroidism-follow-up with endocrinology soon as planned ,  labs today  Obesity-continue to work on efforts to lose weight through healthy diet and exercise  Recurrent cold sores-continue Valtrex  GERD-continue Dexilant   Mairon was seen today for annual exam.  Diagnoses and all orders for this visit:  Encounter for health maintenance examination in adult -     Comprehensive metabolic panel -     CBC -     Lipid panel -     TSH + free T4 -     Comprehensive Thyroglobulin -     Hemoglobin A1c -     VITAMIN D 25 Hydroxy (Vit-D Deficiency, Fractures) -     Chlamydia/Gonococcus/Trichomonas, NAA -     HIV Antibody (routine testing w rflx) -     RPR -     Hepatitis C antibody -     Hepatitis B surface antigen -     Testosterone -     PSA  Need for Tdap vaccination -     Tdap vaccine greater than or equal to 7yo IM  Screen for STD (sexually transmitted disease) -     Chlamydia/Gonococcus/Trichomonas, NAA -     HIV Antibody (routine testing w rflx) -     RPR -     Hepatitis C antibody -     Hepatitis B surface antigen  Gastroesophageal reflux disease, unspecified whether esophagitis present  History of thyroid cancer  Hypothyroidism, unspecified type -     TSH + free T4 -     Comprehensive Thyroglobulin  Screening for diabetes mellitus -     Hemoglobin A1c  Vaccine counseling  S/P thyroidectomy  Mixed hyperlipidemia -     Lipid panel  Screening for prostate cancer -     PSA   Follow-up pending labs, yearly for physical

## 2023-06-22 ENCOUNTER — Other Ambulatory Visit: Payer: Self-pay | Admitting: Medical

## 2023-06-22 LAB — COMPREHENSIVE METABOLIC PANEL
ALT: 36 IU/L (ref 0–44)
AST: 25 IU/L (ref 0–40)
Albumin: 4.4 g/dL (ref 4.1–5.1)
Alkaline Phosphatase: 60 IU/L (ref 44–121)
BUN/Creatinine Ratio: 24 — ABNORMAL HIGH (ref 9–20)
BUN: 20 mg/dL (ref 6–24)
Bilirubin Total: 0.5 mg/dL (ref 0.0–1.2)
CO2: 20 mmol/L (ref 20–29)
Calcium: 9.3 mg/dL (ref 8.7–10.2)
Chloride: 102 mmol/L (ref 96–106)
Creatinine, Ser: 0.85 mg/dL (ref 0.76–1.27)
Globulin, Total: 2.2 g/dL (ref 1.5–4.5)
Glucose: 88 mg/dL (ref 70–99)
Potassium: 4.5 mmol/L (ref 3.5–5.2)
Sodium: 141 mmol/L (ref 134–144)
Total Protein: 6.6 g/dL (ref 6.0–8.5)
eGFR: 107 mL/min/{1.73_m2} (ref >59)

## 2023-06-22 LAB — LIPID PANEL
Chol/HDL Ratio: 5.7 ratio — ABNORMAL HIGH (ref 0.0–5.0)
Cholesterol, Total: 273 mg/dL — ABNORMAL HIGH (ref 100–199)
HDL: 48 mg/dL (ref >39)
LDL Chol Calc (NIH): 204 mg/dL — ABNORMAL HIGH (ref 0–99)
Triglycerides: 118 mg/dL (ref 0–149)
VLDL Cholesterol Cal: 21 mg/dL (ref 5–40)

## 2023-06-22 LAB — CBC
Hematocrit: 49.4 % (ref 37.5–51.0)
Hemoglobin: 16.6 g/dL (ref 13.0–17.7)
MCH: 31.6 pg (ref 26.6–33.0)
MCHC: 33.6 g/dL (ref 31.5–35.7)
MCV: 94 fL (ref 79–97)
Platelets: 234 10*3/uL (ref 150–450)
RBC: 5.25 x10E6/uL (ref 4.14–5.80)
RDW: 12.5 % (ref 11.6–15.4)
WBC: 5.2 10*3/uL (ref 3.4–10.8)

## 2023-06-22 LAB — HEMOGLOBIN A1C
Est. average glucose Bld gHb Est-mCnc: 117 mg/dL
Hgb A1c MFr Bld: 5.7 % — ABNORMAL HIGH (ref 4.8–5.6)

## 2023-06-22 LAB — TESTOSTERONE: Testosterone: 269 ng/dL (ref 264–916)

## 2023-06-22 LAB — HEPATITIS C ANTIBODY: Hep C Virus Ab: NONREACTIVE

## 2023-06-22 LAB — HEPATITIS B SURFACE ANTIGEN: Hepatitis B Surface Ag: NEGATIVE

## 2023-06-22 LAB — COMPREHENSIVE THYROGLOBULIN

## 2023-06-22 LAB — TSH+FREE T4
Free T4: 1.62 ng/dL (ref 0.82–1.77)
TSH: 2.06 u[IU]/mL (ref 0.450–4.500)

## 2023-06-22 LAB — RPR: RPR Ser Ql: NONREACTIVE

## 2023-06-22 LAB — HIV ANTIBODY (ROUTINE TESTING W REFLEX): HIV Screen 4th Generation wRfx: NONREACTIVE

## 2023-06-22 LAB — VITAMIN D 25 HYDROXY (VIT D DEFICIENCY, FRACTURES): Vit D, 25-Hydroxy: 19.4 ng/mL — ABNORMAL LOW (ref 30.0–100.0)

## 2023-06-22 LAB — PSA: Prostate Specific Ag, Serum: 0.4 ng/mL (ref 0.0–4.0)

## 2023-06-22 MED ORDER — VALACYCLOVIR HCL 1 G PO TABS: ORAL_TABLET | ORAL | 0 refills | Status: DC

## 2023-06-22 MED ORDER — VITAMIN D (ERGOCALCIFEROL) 1.25 MG (50000 UNIT) PO CAPS: 50000.0000 [IU] | ORAL_CAPSULE | ORAL | 1 refills | Status: DC

## 2023-06-22 NOTE — Progress Notes (Signed)
Results sent through MyChart

## 2023-06-23 DIAGNOSIS — C73 Malignant neoplasm of thyroid gland: Secondary | ICD-10-CM | POA: Diagnosis not present

## 2023-06-23 LAB — CHLAMYDIA/GONOCOCCUS/TRICHOMONAS, NAA
Chlamydia by NAA: NEGATIVE
Gonococcus by NAA: NEGATIVE
Trich vag by NAA: NEGATIVE

## 2023-06-23 NOTE — Progress Notes (Signed)
Results sent through MyChart

## 2023-06-28 ENCOUNTER — Encounter: Payer: Self-pay | Admitting: Family Medicine

## 2023-06-28 ENCOUNTER — Ambulatory Visit: Payer: BC Managed Care – PPO | Admitting: Family Medicine

## 2023-06-28 VITALS — BP 132/88 | Ht 71.0 in | Wt 280.0 lb

## 2023-06-28 DIAGNOSIS — M1710 Unilateral primary osteoarthritis, unspecified knee: Secondary | ICD-10-CM

## 2023-06-28 NOTE — Progress Notes (Addendum)
PCP: Jac Canavan, PA-C  Subjective:   HPI: Patient is a 46 y.o. male here for right-sided knee pain.  Patient states that the pain usually occurs whenever he is sitting down for extended periods of time.  Patient is a sometimes it will be an hour sometimes it will be 10 minutes.  Patient states that his first few steps after sitting down for long periods of time are always painful.  Patient states that his leg will feel stiff for about 5 to 10 minutes and then eventually after few minutes of walking the pain goes away and does not recur.  Patient states that he can be very active and the pain will never recur and it is always after he is seated for period of time.  Patient states that he can run 2 miles without any issues and knee pain at all.  Patient denies any trauma to the area.  Patient notes that he did feel like he damaged his knees back in high school but has been along well since then.  Patient otherwise has no other concerns at this time.   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 Fanny Skates   Recurrent cold sores    Wears glasses     Current Outpatient Medications on File Prior to Visit  Medication Sig Dispense Refill   dexlansoprazole (DEXILANT) 60 MG capsule Take 1 capsule by mouth 2 (two) times daily.     levothyroxine (SYNTHROID, LEVOTHROID) 200 MCG tablet Take 200 mcg by mouth daily.     NONFORMULARY OR COMPOUNDED ITEM      valACYclovir (VALTREX) 1000 MG tablet TAKE 1 TABLET BY MOUTH FOR SUPPRESSION DAILY, THEN 2 TABLETS FOR 1 DAYS FOR FLARE UPS 270 tablet 0   Vitamin D, Ergocalciferol, (DRISDOL) 1.25 MG (50000 UNIT) CAPS capsule Take 1 capsule (50,000 Units total) by mouth every 7 (seven) days. 12 capsule 1   No current facility-administered medications on file prior to visit.    Past  Surgical History:  Procedure Laterality Date   CLOSED REDUCTION TIBIAL FRACTURE     left   THYROIDECTOMY  03/27/2010   UPPER GI ENDOSCOPY      No Known Allergies  BP 132/88   Ht 5\' 11"  (1.803 m)   Wt 280 lb (127 kg)   BMI 39.05 kg/m       No data to display              No data to display              Objective:  Physical Exam:  Gen: NAD, comfortable in exam room  Knee, right: Inspection was negative for erythema, ecchymosis, and effusion. No obvious bony abnormalities or signs of osteophyte development. Palpation yielded no asymmetric warmth; No joint line tenderness; No condyle tenderness; No patellar tenderness; No knee crepitus. Patellar and quadriceps tendons unremarkable, and no tenderness of the pes anserine bursa. No obvious Baker's cyst development. ROM normal in flexion (135 degrees) and extension (0 degrees). Strength 5/5 with knee flexion and extension. Neurovascularly intact bilaterally.   Provocative Testing:    - Patella:   - Patellar grind/compression: Noted some crepitus, no pain   - Patellar glide: Appropriate medial/lateral glide without apprehension  - Collateral Ligaments:   - Varus/Valgus (MCL/LCL) Stress test at 0,  15d: NEG   - McMurray's: NEG    Assessment & Plan:  1. 1. Patellofemoral arthritis - Patient's previous x-rays as well as physical exam are likely consistent with patellofemoral arthritis.  Patient has early degeneration of the patellofemoral region.  Patient would benefit from quadricep strengthening exercises at this time.  Patient was given an exercise series advised to do progressively increasing weight on the exercises with dumbbells or body weight as tolerated.  Would like to see patient back in 8 weeks, if there is no improvement at that time we will consider repeat imaging and decision for next steps.  Patient is understanding and agreeable with plan.   Brenton Grills MD, PGY-4  Sports Medicine Fellow [facility]  Addendum:  Patient seen in the office by fellow.  History, exam, plan of care were precepted with me.  Darene Lamer, DO, CAQSM

## 2023-08-30 ENCOUNTER — Ambulatory Visit: Payer: BC Managed Care – PPO | Admitting: Family Medicine

## 2023-08-31 ENCOUNTER — Ambulatory Visit: Payer: BC Managed Care – PPO | Admitting: Medical

## 2023-08-31 ENCOUNTER — Other Ambulatory Visit (HOSPITAL_COMMUNITY): Payer: Self-pay

## 2023-08-31 ENCOUNTER — Telehealth: Payer: Self-pay

## 2023-08-31 ENCOUNTER — Encounter: Payer: Self-pay | Admitting: Medical

## 2023-08-31 VITALS — BP 120/84 | HR 64 | Temp 98.6°F | Ht 71.0 in | Wt 287.4 lb

## 2023-08-31 DIAGNOSIS — Z136 Encounter for screening for cardiovascular disorders: Secondary | ICD-10-CM

## 2023-08-31 DIAGNOSIS — J329 Chronic sinusitis, unspecified: Secondary | ICD-10-CM | POA: Diagnosis not present

## 2023-08-31 DIAGNOSIS — H6690 Otitis media, unspecified, unspecified ear: Secondary | ICD-10-CM

## 2023-08-31 DIAGNOSIS — Z6839 Body mass index (BMI) 39.0-39.9, adult: Secondary | ICD-10-CM

## 2023-08-31 DIAGNOSIS — E559 Vitamin D deficiency, unspecified: Secondary | ICD-10-CM

## 2023-08-31 DIAGNOSIS — E782 Mixed hyperlipidemia: Secondary | ICD-10-CM

## 2023-08-31 DIAGNOSIS — E039 Hypothyroidism, unspecified: Secondary | ICD-10-CM | POA: Diagnosis not present

## 2023-08-31 MED ORDER — DIETHYLPROPION HCL ER 75 MG PO TB24: 1.0000 | ORAL_TABLET | ORAL | 1 refills | Status: DC

## 2023-08-31 MED ORDER — AMOXICILLIN 875 MG PO TABS: 875.0000 mg | ORAL_TABLET | Freq: Two times a day (BID) | ORAL | 0 refills | Status: DC

## 2023-08-31 NOTE — Telephone Encounter (Signed)
Pharmacy Patient Advocate Encounter   Received notification from Physician's Office that prior authorization for Diethylpropion HCl ER 75MG  er tablets is required/requested.   Insurance verification completed.   The patient is insured through [employer] .   Per test claim: PA required; PA submitted to above mentioned insurance via CoverMyMeds Key/confirmation #/EOC Key: [id number] Status is pending

## 2023-08-31 NOTE — Progress Notes (Signed)
Subjective:  Bradley Watts is a 47 y.o. male who presents for Chief Complaint  Patient presents with   Consult    Started on statin and rx's vit d by Dr. Evlyn Kanner and wanted to follow up with labs today, is not fasting though. Also has yellow mucus and is coughing. Started before Thanksgiving.      Here for recheck, med check.  At his last visit in September, 06/23/23 we asked him to start statin given his lipid numbers.  He is taking Crestor but only taking 2 or 3 days/week.  He has tried to make some dietary changes.  He is eating more of a low-carb diet but eating more meat and protein to help lose weight.    No reported side effects of statin   Vitamin D deficiency-he is using the vitamin D supplement began last visit   He is nonfasting today.    He would like to go back on diethylpropion to help with weight loss efforts.  He is exercising and trying to eat low-carb   He notes about a week history of sinus pressure, ear pain, congestion.  Started after blowing the leaves last week but now has mucus and congestion not improving despite over-the-counter congestion remedy.   No other aggravating or relieving factors.    No other c/o.  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 Fanny Skates   Recurrent cold sores    Wears glasses    Current Outpatient Medications on File Prior to Visit  Medication Sig Dispense Refill   dexlansoprazole (DEXILANT) 60 MG capsule Take 1 capsule by mouth 2 (two) times daily.     levothyroxine (SYNTHROID, LEVOTHROID) 200 MCG tablet Take 200 mcg by mouth daily.     NONFORMULARY OR COMPOUNDED ITEM      rosuvastatin (CRESTOR) 5 MG tablet Take 5 mg by mouth daily.     valACYclovir (VALTREX) 1000 MG tablet TAKE 1 TABLET BY MOUTH FOR SUPPRESSION DAILY, THEN 2 TABLETS FOR 1 DAYS FOR  FLARE UPS (Patient not taking: Reported on 08/31/2023) 270 tablet 0   Vitamin D, Ergocalciferol, (DRISDOL) 1.25 MG (50000 UNIT) CAPS capsule Take 1 capsule (50,000 Units total) by mouth every 7 (seven) days. (Patient not taking: Reported on 08/31/2023) 12 capsule 1   No current facility-administered medications on file prior to visit.     The following portions of the patient's history were reviewed and updated as appropriate: allergies, current medications, past family history, past medical history, past social history, past surgical history and problem list.  ROS Otherwise as in subjective above  Objective: BP 120/84   Pulse 64   Temp 98.6 F (37 C) (Tympanic)   Ht 5\' 11"  (1.803 m)   Wt 287 lb 6.4 oz (130.4 kg)   BMI 40.08 kg/m   General appearance: alert, no distress, well developed, well nourished HEENT: normocephalic, sclerae anicteric, conjunctiva pink and moist, TMs with erythema, nares patent, no discharge ,+ erythema, pharynx with post nasal drainage Oral cavity: MMM, no lesions Neck: supple, no lymphadenopathy, no thyromegaly, no masses Heart: RRR, normal S1, S2, no murmurs Lungs: CTA bilaterally, no wheezes, rhonchi, or rales    Assessment: Encounter Diagnoses  Name Primary?   Mixed hyperlipidemia Yes   Hypothyroidism, unspecified type    BMI 39.0-39.9,adult    Sinusitis,  unspecified chronicity, unspecified location    Ear infection    Vitamin D deficiency    Screening for heart disease      Plan: Hyperlipidemia-he is using Crestor 2 to 3 days/week.  He will return tomorrow for fasting labs.  We will also pursue CT coronary test to screen for heart disease  Hypothyroidism-managed by endocrinology  BMI greater than 30-continue efforts with exercise and healthy diet.  Counseled on diet and exercise.  Begin back on trial diethylpropion which she has used in the past.  Ear infection, sinus infection-begin amoxicillin, continue over-the-counter remedy for mucus.   If not much improved within the next week then let me know  Vitamin D deficiency-updated labs since he has started vitamin D supplement  Keldrick was seen today for consult.  Diagnoses and all orders for this visit:  Mixed hyperlipidemia -     Lipid panel; Future -     Comprehensive metabolic panel; Future  Hypothyroidism, unspecified type  BMI 39.0-39.9,adult  Sinusitis, unspecified chronicity, unspecified location  Ear infection  Vitamin D deficiency -     VITAMIN D 25 Hydroxy (Vit-D Deficiency, Fractures); Future  Screening for heart disease -     CT CARDIAC SCORING (SELF PAY ONLY); Future  Other orders -     Diethylpropion HCl CR 75 MG TB24; Take 1 tablet (75 mg total) by mouth every other day. -     amoxicillin (AMOXIL) 875 MG tablet; Take 1 tablet (875 mg total) by mouth 2 (two) times daily for 10 days.    Follow up: return for fasting labs

## 2023-09-01 ENCOUNTER — Other Ambulatory Visit: Payer: BC Managed Care – PPO

## 2023-09-01 DIAGNOSIS — E559 Vitamin D deficiency, unspecified: Secondary | ICD-10-CM

## 2023-09-01 DIAGNOSIS — E782 Mixed hyperlipidemia: Secondary | ICD-10-CM | POA: Diagnosis not present

## 2023-09-02 ENCOUNTER — Other Ambulatory Visit (HOSPITAL_COMMUNITY): Payer: Self-pay

## 2023-09-02 ENCOUNTER — Other Ambulatory Visit: Payer: Self-pay | Admitting: Medical

## 2023-09-02 LAB — COMPREHENSIVE METABOLIC PANEL
ALT: 28 [IU]/L (ref 0–44)
AST: 20 [IU]/L (ref 0–40)
Albumin: 4.3 g/dL (ref 4.1–5.1)
Alkaline Phosphatase: 61 [IU]/L (ref 44–121)
BUN/Creatinine Ratio: 25 — ABNORMAL HIGH (ref 9–20)
BUN: 21 mg/dL (ref 6–24)
Bilirubin Total: 0.6 mg/dL (ref 0.0–1.2)
CO2: 23 mmol/L (ref 20–29)
Calcium: 9.4 mg/dL (ref 8.7–10.2)
Chloride: 104 mmol/L (ref 96–106)
Creatinine, Ser: 0.84 mg/dL (ref 0.76–1.27)
Globulin, Total: 2.3 g/dL (ref 1.5–4.5)
Glucose: 98 mg/dL (ref 70–99)
Potassium: 5.3 mmol/L — ABNORMAL HIGH (ref 3.5–5.2)
Sodium: 141 mmol/L (ref 134–144)
Total Protein: 6.6 g/dL (ref 6.0–8.5)
eGFR: 108 mL/min/{1.73_m2} (ref >59)

## 2023-09-02 LAB — LIPID PANEL
Chol/HDL Ratio: 5.7 {ratio} — ABNORMAL HIGH (ref 0.0–5.0)
Cholesterol, Total: 194 mg/dL (ref 100–199)
HDL: 34 mg/dL — ABNORMAL LOW (ref >39)
LDL Chol Calc (NIH): 137 mg/dL — ABNORMAL HIGH (ref 0–99)
Triglycerides: 124 mg/dL (ref 0–149)
VLDL Cholesterol Cal: 23 mg/dL (ref 5–40)

## 2023-09-02 LAB — VITAMIN D 25 HYDROXY (VIT D DEFICIENCY, FRACTURES): Vit D, 25-Hydroxy: 38.9 ng/mL (ref 30.0–100.0)

## 2023-09-02 MED ORDER — ROSUVASTATIN CALCIUM 10 MG PO TABS: 10.0000 mg | ORAL_TABLET | Freq: Every day | ORAL | 3 refills | Status: DC

## 2023-09-02 MED ORDER — VITAMIN D (ERGOCALCIFEROL) 1.25 MG (50000 UNIT) PO CAPS: 50000.0000 [IU] | ORAL_CAPSULE | ORAL | 3 refills | Status: DC

## 2023-09-02 NOTE — Telephone Encounter (Signed)
Pharmacy Patient Advocate Encounter  Received notification from [facility] that Prior Authorization for Diethylpropion HCl ER 75MG  er tablets  has been APPROVED from 11.4.24 to 3.4.25. Ran test claim, and The Rx has been successfully picked up as of 09/01/23 . This test claim was processed through [facility]- copay amounts may vary at other pharmacies due to pharmacy/plan contracts, or as the patient moves through the different stages of their insurance plan.   PA #/Case ID/Reference #: Key: [id number]

## 2023-09-02 NOTE — Progress Notes (Signed)
Results sent through MyChart

## 2023-09-05 ENCOUNTER — Other Ambulatory Visit: Payer: Self-pay | Admitting: Medical

## 2023-09-05 MED ORDER — HYDROCOD POLI-CHLORPHE POLI ER 10-8 MG/5ML PO SUER: 5.0000 mL | Freq: Two times a day (BID) | ORAL | 0 refills | Status: DC

## 2023-09-05 MED ORDER — BENZONATATE 200 MG PO CAPS: 200.0000 mg | ORAL_CAPSULE | Freq: Three times a day (TID) | ORAL | 0 refills | Status: DC | PRN

## 2023-09-08 ENCOUNTER — Ambulatory Visit (HOSPITAL_COMMUNITY)
Admission: RE | Admit: 2023-09-08 | Discharge: 2023-09-08 | Disposition: A | Discharge status: Home / Self Care | Payer: Self-pay | Source: Ambulatory Visit | Attending: Medical | Admitting: Medical

## 2023-09-08 DIAGNOSIS — Z136 Encounter for screening for cardiovascular disorders: Secondary | ICD-10-CM | POA: Insufficient documentation

## 2023-09-12 ENCOUNTER — Other Ambulatory Visit (HOSPITAL_COMMUNITY): Payer: Self-pay

## 2023-09-12 ENCOUNTER — Other Ambulatory Visit: Payer: Self-pay | Admitting: Medical

## 2023-09-12 MED ORDER — ZEPBOUND 5 MG/0.5ML ~~LOC~~ SOAJ: 5.0000 mg | SUBCUTANEOUS | 0 refills | Status: DC

## 2023-09-12 MED ORDER — ZEPBOUND 2.5 MG/0.5ML ~~LOC~~ SOAJ: 2.5000 mg | SUBCUTANEOUS | 0 refills | Status: DC

## 2023-09-12 NOTE — Progress Notes (Signed)
Results sent through MyChart

## 2023-09-13 ENCOUNTER — Other Ambulatory Visit (HOSPITAL_COMMUNITY): Payer: Self-pay

## 2023-09-13 ENCOUNTER — Telehealth: Payer: Self-pay

## 2023-09-13 NOTE — Telephone Encounter (Signed)
Pharmacy Patient Advocate Encounter   Received notification from Physician's Office that prior authorization for Zepbound is required/requested.   Insurance verification completed.   The patient is insured through [employer] .   Per test claim: PA required; PA submitted to above mentioned insurance via CoverMyMeds Key/confirmation #/EOC Key: [id number]     Status is pending

## 2023-09-14 ENCOUNTER — Other Ambulatory Visit (HOSPITAL_COMMUNITY): Payer: Self-pay

## 2023-09-14 NOTE — Telephone Encounter (Signed)
Pharmacy Patient Advocate Encounter  Received notification from [facility] that Prior Authorization for Zepbound has been APPROVED from 11.17.24 to 8.14.25. Ran test claim, Copay is $30.00. This test claim was processed through [facility]- copay amounts may vary at other pharmacies due to pharmacy/plan contracts, or as the patient moves through the different stages of their insurance plan.   PA #/Case ID/Reference #: Key: Bradley Watts

## 2023-10-19 ENCOUNTER — Encounter: Payer: Self-pay | Admitting: Medical

## 2023-10-19 DIAGNOSIS — E559 Vitamin D deficiency, unspecified: Secondary | ICD-10-CM | POA: Insufficient documentation

## 2023-10-28 ENCOUNTER — Other Ambulatory Visit: Payer: Self-pay | Admitting: Medical

## 2024-03-23 ENCOUNTER — Encounter: Payer: Self-pay | Admitting: Medical

## 2024-04-12 ENCOUNTER — Other Ambulatory Visit (HOSPITAL_COMMUNITY): Payer: Self-pay

## 2024-04-16 ENCOUNTER — Other Ambulatory Visit (HOSPITAL_COMMUNITY): Payer: Self-pay

## 2024-04-16 ENCOUNTER — Telehealth: Payer: Self-pay

## 2024-04-16 NOTE — Telephone Encounter (Signed)
 Pharmacy Patient Advocate Encounter   Received notification from CoverMyMeds that prior authorization for Zepbound  2.5MG /0.5ML pen-injectors  is required/requested.   Insurance verification completed.   The patient is insured through [employer] .   Per test claim: PA required; PA submitted to above mentioned insurance via CoverMyMeds Key/confirmation #/EOC (Key: [id number])  Status is pending

## 2024-04-17 ENCOUNTER — Other Ambulatory Visit (HOSPITAL_COMMUNITY): Payer: Self-pay

## 2024-04-17 NOTE — Telephone Encounter (Signed)
 Pharmacy Patient Advocate Encounter  Received notification from [facility] that Prior Authorization for Zepbound  2.5MG /0.5ML pen-injectors has been APPROVED from 6.21.25 to 3.18.26. Ran test claim, Copay is $30.00. This test claim was processed through [facility]- copay amounts may vary at other pharmacies due to pharmacy/plan contracts, or as the patient moves through the different stages of their insurance plan.   PA #/Case ID/Reference #: (Key: [id number])

## 2024-06-21 ENCOUNTER — Encounter: Payer: BC Managed Care – PPO | Admitting: Medical

## 2024-06-25 ENCOUNTER — Ambulatory Visit: Admitting: Medical

## 2024-06-25 VITALS — BP 120/80 | HR 65 | Ht 71.0 in | Wt 264.2 lb

## 2024-06-25 DIAGNOSIS — Z6839 Body mass index (BMI) 39.0-39.9, adult: Secondary | ICD-10-CM

## 2024-06-25 DIAGNOSIS — Z9089 Acquired absence of other organs: Secondary | ICD-10-CM | POA: Diagnosis not present

## 2024-06-25 DIAGNOSIS — Z131 Encounter for screening for diabetes mellitus: Secondary | ICD-10-CM

## 2024-06-25 DIAGNOSIS — E782 Mixed hyperlipidemia: Secondary | ICD-10-CM | POA: Diagnosis not present

## 2024-06-25 DIAGNOSIS — E039 Hypothyroidism, unspecified: Secondary | ICD-10-CM

## 2024-06-25 DIAGNOSIS — Z9889 Other specified postprocedural states: Secondary | ICD-10-CM | POA: Diagnosis not present

## 2024-06-25 DIAGNOSIS — E559 Vitamin D deficiency, unspecified: Secondary | ICD-10-CM | POA: Diagnosis not present

## 2024-06-25 DIAGNOSIS — B001 Herpesviral vesicular dermatitis: Secondary | ICD-10-CM

## 2024-06-25 DIAGNOSIS — K219 Gastro-esophageal reflux disease without esophagitis: Secondary | ICD-10-CM

## 2024-06-25 DIAGNOSIS — Z7185 Encounter for immunization safety counseling: Secondary | ICD-10-CM

## 2024-06-25 DIAGNOSIS — Z0000 Encounter for general adult medical examination without abnormal findings: Secondary | ICD-10-CM

## 2024-06-25 DIAGNOSIS — R5383 Other fatigue: Secondary | ICD-10-CM | POA: Diagnosis not present

## 2024-06-25 LAB — LIPID PANEL

## 2024-06-25 MED ORDER — VALACYCLOVIR HCL 1 G PO TABS: 1000.0000 mg | ORAL_TABLET | Freq: Every day | ORAL | 1 refills | Status: AC

## 2024-06-25 MED ORDER — VALACYCLOVIR HCL 1 G PO TABS: ORAL_TABLET | ORAL | 0 refills | Status: DC

## 2024-06-25 MED ORDER — ZEPBOUND 7.5 MG/0.5ML ~~LOC~~ SOLN: 7.5000 mg | SUBCUTANEOUS | 0 refills | Status: DC

## 2024-06-25 MED ORDER — ZEPBOUND 10 MG/0.5ML ~~LOC~~ SOLN: 10.0000 mg | SUBCUTANEOUS | 0 refills | Status: DC

## 2024-06-25 NOTE — Progress Notes (Signed)
 Name: Bradley Watts   Date of Visit: 06/25/24   Date of last visit with me: Visit date not found   CHIEF COMPLAINT:  Chief Complaint  Patient presents with   Annual Exam    Fasting cpe, declines flu and covid today, no concerns       HPI:  Discussed the use of AI scribe software for clinical note transcription with the patient, who gave verbal consent to proceed.   History of Present Illness   Bradley Watts is a 48 year old male who presents for a routine follow-up visit.  He is currently taking Dexilant  60 mg once daily, sometimes BID, levothyroxine 200 mcg daily, rosuvastatin  10 mg daily, Valtrex  daily for suppression as needed, vitamin D  50,000 IU weekly, and Zepbound  5 mg, which he started two months ago.  After the first dose of Zepbound , he experienced a rash, which resolved and has not recurred. He also reports decreased appetite and a reduction in bowel movements, though he does not consider it constipation as he is not having difficulty with bowel movements.  He is ready to increase dose.    He has a history of mild diverticulosis and hemorrhoids noted on a colonoscopy performed in 2023. He regularly monitors his thyroid  levels with his endocrinologist.  No current concerns for HIV or STD screening.       Reviewed their medical, surgical, family, social, medication, and allergy history and updated chart as appropriate.  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 Dallas Hoar   Recurrent cold sores    Wears glasses      Current Outpatient Medications:    dexlansoprazole  (DEXILANT ) 60 MG capsule, Take 1 capsule by mouth 2 (two) times daily., Disp: , Rfl:    levothyroxine (SYNTHROID, LEVOTHROID) 200 MCG tablet, Take 200 mcg by mouth daily., Disp: , Rfl:     rosuvastatin  (CRESTOR ) 10 MG tablet, Take 1 tablet (10 mg total) by mouth daily., Disp: 90 tablet, Rfl: 3   tirzepatide  (ZEPBOUND ) 10 MG/0.5ML injection vial, Inject 10 mg into the skin once a week., Disp: 2 mL, Rfl: 0   tirzepatide  (ZEPBOUND ) 7.5 MG/0.5ML injection vial, Inject 7.5 mg into the skin once a week., Disp: 0.5 mL, Rfl: 0   valACYclovir  (VALTREX ) 1000 MG tablet, Take 1 tablet (1,000 mg total) by mouth daily., Disp: 90 tablet, Rfl: 1   Vitamin D , Ergocalciferol , (DRISDOL ) 1.25 MG (50000 UNIT) CAPS capsule, Take 1 capsule (50,000 Units total) by mouth every 7 (seven) days., Disp: 12 capsule, Rfl: 3   valACYclovir  (VALTREX ) 1000 MG tablet, 2 TABLETS BID FOR 2 DAY FOR FLARE UPS., Disp: 32 tablet, Rfl: 0  Family History  Problem Relation Age of Onset   Other Father        died in MVA   Diabetes Brother        type 1   Diabetes Daughter        type 1   Heart disease Maternal Grandfather 2   Cancer Paternal Grandfather        lung cancer   Hyperlipidemia Neg Hx    Hypertension Neg Hx    Stroke Neg Hx     Past Surgical History:  Procedure Laterality Date   CLOSED REDUCTION TIBIAL FRACTURE  left   THYROIDECTOMY  03/27/2010   UPPER GI ENDOSCOPY       Review of Systems  Constitutional:  Negative for chills, fever, malaise/fatigue and weight loss.  HENT:  Negative for congestion, ear pain, hearing loss, sore throat and tinnitus.   Eyes:  Negative for blurred vision, pain and redness.  Respiratory:  Negative for cough, hemoptysis and shortness of breath.   Cardiovascular:  Negative for chest pain, palpitations, orthopnea, claudication and leg swelling.  Gastrointestinal:  Negative for abdominal pain, blood in stool, constipation, diarrhea, nausea and vomiting.  Genitourinary:  Negative for dysuria, flank pain, frequency, hematuria and urgency.  Musculoskeletal:  Negative for falls, joint pain and myalgias.  Skin:  Negative for itching and rash.  Neurological:  Negative  for dizziness, tingling, speech change, weakness and headaches.  Endo/Heme/Allergies:  Negative for polydipsia. Does not bruise/bleed easily.  Psychiatric/Behavioral:  Negative for depression and memory loss. The patient is not nervous/anxious and does not have insomnia.      OBJECTIVE:    BP 120/80   Pulse 65   Ht 5' 11 (1.803 m)   Wt 264 lb 3.2 oz (119.8 kg)   SpO2 99%   BMI 36.85 kg/m   BP Readings from Last 3 Encounters:  06/25/24 120/80  08/31/23 120/84  06/28/23 132/88    Wt Readings from Last 3 Encounters:  06/25/24 264 lb 3.2 oz (119.8 kg)  08/31/23 287 lb 6.4 oz (130.4 kg)  06/28/23 280 lb (127 kg)    Physical Exam   General appearance: alert, no distress, WD/WN, Caucasian male Skin: unremarkable HEENT: normocephalic, conjunctiva/corneas normal, sclerae anicteric, PERRLA, EOMi, nares patent, no discharge or erythema, pharynx normal Oral cavity: MMM, tongue normal, teeth normal Neck: supple, no lymphadenopathy, no thyromegaly, no masses, normal ROM, no bruits Chest: non tender, normal shape and expansion Heart: RRR, normal S1, S2, no murmurs Lungs: CTA bilaterally, no wheezes, rhonchi, or rales Abdomen: +bs, soft, non tender, non distended, no masses, no hepatomegaly, no splenomegaly, no bruits Back: non tender, normal ROM, no scoliosis Musculoskeletal: upper extremities non tender, no obvious deformity, normal ROM throughout, lower extremities non tender, no obvious deformity, normal ROM throughout Extremities: no edema, no cyanosis, no clubbing Pulses: 2+ symmetric, upper and lower extremities, normal cap refill Neurological: alert, oriented x 3, CN2-12 intact, strength normal upper extremities and lower extremities, sensation normal throughout, DTRs 2+ throughout, no cerebellar signs, gait normal Psychiatric: normal affect, behavior normal, pleasant  GU: normal male external genitalia,circumcised, nontender, no masses, no hernia, no lymphadenopathy Rectal:  deferred   ASSESSMENT/PLAN:   Encounter Diagnoses  Name Primary?   Encounter for health maintenance examination in adult Yes   Mixed hyperlipidemia    Hypothyroidism, unspecified type    Vitamin D  deficiency    S/P thyroidectomy    BMI 39.0-39.9,adult    Gastroesophageal reflux disease, unspecified whether esophagitis present    Recurrent cold sores    Screening for diabetes mellitus    Vaccine counseling    Fatigue, unspecified type     Significant other issues:   Obesity Doing well on Zepbound . Decreased hunger and calorie intake reported. Transient rash noted, likely unrelated. - Continue Zepbound , increase to 7.5 mg for one month, then titrate to 10 mg as tolerated. - Monitor for indigestion, acid reflux, heartburn, constipation. - Follow-up every two months while titrating.  Hypothyroidism status post thyroid  cancer Managed with levothyroxine 200 mcg daily. Under regular endocrinology care. Requires thyroid  function monitoring. - Order TSH, free T4, thyroglobulin.  Gastroesophageal reflux disease (GERD) Managed with Dexilant  60 mg daily. - Continue Dexilant  60 mg daily, BID if needed.  Hyperlipidemia Managed with rosuvastatin  10 mg daily. Routine lipid monitoring necessary. - Continue rosuvastatin  10 mg daily. - Order lipid panel.  Vitamin D  deficiency Managed with 50,000 IU vitamin D  weekly. Routine monitoring necessary. - Continue vitamin D  50,000 IU weekly. - Order vitamin D  level.       This visit was a preventative care visit, also known as wellness visit or routine physical.   Topics typically include healthy lifestyle, diet, exercise, preventative care, vaccinations, sick and well care, proper use of emergency dept and after hours care, as well as other concerns.     General Recommendations: Continue to return yearly for your annual wellness and preventative care visits.  This gives us  a chance to discuss healthy lifestyle, exercise, vaccinations, review  your chart record, and perform screenings where appropriate.  I recommend you see your eye doctor yearly for routine vision care.  I recommend you see your dentist yearly for routine dental care including hygiene visits twice yearly.   Vaccination  Immunization History  Administered Date(s) Administered   Tdap 04/23/2013, 06/21/2023    Vaccine recommendations: Flu shot  Vaccines administered today: declines   Screening for cancer: Colon cancer screening: Prior or last colon cancer screen: 2023, repeat in 2033   Testicular cancer screening You should do a monthly self testicular exam if you are between 1-65 years old, and we typically do a testicular exam on the yearly physical for this same age group.   Prostate Cancer screening: The recommended prostate cancer screening test is a blood test called the prostate-specific antigen (PSA) test. PSA is a protein that is made in the prostate. As you age, your prostate naturally produces more PSA. Abnormally high PSA levels may be caused by: Prostate cancer. An enlarged prostate that is not caused by cancer (benign prostatic hyperplasia, or BPH). This condition is very common in older men. A prostate gland infection (prostatitis) or urinary tract infection. Certain medicines such as male hormones (like testosterone ) or other medicines that raise testosterone  levels. A rectal exam may be done as part of prostate cancer screening to help provide information about the size of your prostate gland. When a rectal exam is performed, it should be done after the PSA level is drawn to avoid any effect on the results.   Skin cancer screening: Check your skin regularly for new changes, growing lesions, or other lesions of concern Come in for evaluation if you have skin lesions of concern.   Lung cancer screening: If you have a greater than 20 pack year history of tobacco use, then you may qualify for lung cancer screening with a chest CT  scan.   Please call your insurance company to inquire about coverage for this test.   Pancreatic cancer:  no current screening test is available or routinely recommended. (risk factors: smoking, overweight or obese, diabetes, chronic pancreatitis, work exposure - dry cleaning, metal working, 48yo>, M>F, Tree surgeon, family hx/o, hereditary breast, ovarian, melanoma, lynch, peutz-jeghers).  Symptoms: jaundice, dark urine, light color or greasy stools, itchy skin, belly or back pain, weight loss, poor appetite, nausea, vomiting, liver enlargement, DVT/blood clots.   We currently don't have screenings for other cancers besides breast, cervical, colon, and lung cancers.  If you have a strong family history of cancer or have other cancer screening concerns, please let me know.  Genetic testing referral is an option for  individuals with high cancer risk in the family.  There are some other cancer screenings in development currently.   Bone health: Get at least 150 minutes of aerobic exercise weekly Get weight bearing exercise at least once weekly Bone density test:  A bone density test is an imaging test that uses a type of X-ray to measure the amount of calcium  and other minerals in your bones. The test may be used to diagnose or screen you for a condition that causes weak or thin bones (osteoporosis), predict your risk for a broken bone (fracture), or determine how well your osteoporosis treatment is working. The bone density test is recommended for females 65 and older, or females or males <65 if certain risk factors such as thyroid  disease, long term use of steroids such as for asthma or rheumatological issues, vitamin D  deficiency, estrogen deficiency, family history of osteoporosis, self or family history of fragility fracture in first degree relative.    Heart health: Get at least 150 minutes of aerobic exercise weekly Limit alcohol  It is important to maintain a healthy blood pressure  and healthy cholesterol numbers  Heart disease screening: Screening for heart disease includes screening for blood pressure, fasting lipids, glucose/diabetes screening, BMI height to weight ratio, reviewed of smoking status, physical activity, and diet.    Goals include blood pressure 120/80 or less, maintaining a healthy lipid/cholesterol profile, preventing diabetes or keeping diabetes numbers under good control, not smoking or using tobacco products, exercising most days per week or at least 150 minutes per week of exercise, and eating healthy variety of fruits and vegetables, healthy oils, and avoiding unhealthy food choices like fried food, fast food, high sugar and high cholesterol foods.    Other tests may possibly include EKG test, CT coronary calcium  score, echocardiogram, exercise treadmill stress test.      Vascular disease screening: For higher risk individuals including smokers, diabetics, patients with known heart disease or high blood pressure, kidney disease, and others, screening for vascular disease or atherosclerosis of the arteries is available.  Examples may include carotid ultrasound, abdominal aortic ultrasound, ABI blood flow screening in the legs, thoracic aorta screening.   Medical care options: I recommend you continue to seek care here first for routine care.  We try really hard to have available appointments Monday through Friday daytime hours for sick visits, acute visits, and physicals.  Urgent care should be used for after hours and weekends for significant issues that cannot wait till the next day.  The emergency department should be used for significant potentially life-threatening emergencies.  The emergency department is expensive, can often have long wait times for less significant concerns, so try to utilize primary care, urgent care, or telemedicine when possible to avoid unnecessary trips to the emergency department.  Virtual visits and telemedicine have been  introduced since the pandemic started in 2020, and can be convenient ways to receive medical care.  We offer virtual appointments as well to assist you in a variety of options to seek medical care.   Legal  Take the time to do a last will and testament, Advanced Directives including Health Care Power of Attorney and Living Will documents.  Don't leave your family with burdens that can be handled ahead of time.   Advanced Directives: I recommend you consider completing a Health Care Power of Attorney and Living Will.   These documents respect your wishes and help alleviate burdens on your loved ones if you were to become terminally ill or  be in a position to need those documents enforced.    You can complete Advanced Directives yourself, have them notarized, then have copies made for our office, for you and for anybody you feel should have them in safe keeping.  Or, you can have an attorney prepare these documents.   If you haven't updated your Last Will and Testament in a while, it may be worthwhile having an attorney prepare these documents together and save on some costs.       Tashaun was seen today for annual exam.  Diagnoses and all orders for this visit:  Encounter for health maintenance examination in adult -     CBC -     Comprehensive metabolic panel with GFR -     Lipid panel -     PSA -     TSH + free T4 -     Comprehensive Thyroglobulin -     VITAMIN D  25 Hydroxy (Vit-D Deficiency, Fractures) -     Hemoglobin A1c -     Testosterone   Mixed hyperlipidemia -     Lipid panel  Hypothyroidism, unspecified type  Vitamin D  deficiency -     VITAMIN D  25 Hydroxy (Vit-D Deficiency, Fractures)  S/P thyroidectomy -     TSH + free T4 -     Comprehensive Thyroglobulin  BMI 39.0-39.9,adult  Gastroesophageal reflux disease, unspecified whether esophagitis present  Recurrent cold sores  Screening for diabetes mellitus -     Hemoglobin A1c  Vaccine counseling  Fatigue,  unspecified type -     Testosterone   Other orders -     valACYclovir  (VALTREX ) 1000 MG tablet; 2 TABLETS BID FOR 2 DAY FOR FLARE UPS. -     tirzepatide  (ZEPBOUND ) 7.5 MG/0.5ML injection vial; Inject 7.5 mg into the skin once a week. -     tirzepatide  (ZEPBOUND ) 10 MG/0.5ML injection vial; Inject 10 mg into the skin once a week. -     valACYclovir  (VALTREX ) 1000 MG tablet; Take 1 tablet (1,000 mg total) by mouth daily.   I recommend follow up yearly for a routine physical.   [facility]

## 2024-06-26 ENCOUNTER — Other Ambulatory Visit: Payer: Self-pay | Admitting: Medical

## 2024-06-26 ENCOUNTER — Ambulatory Visit: Payer: Self-pay | Admitting: Medical

## 2024-06-26 MED ORDER — ROSUVASTATIN CALCIUM 10 MG PO TABS: 10.0000 mg | ORAL_TABLET | Freq: Every day | ORAL | 3 refills | Status: DC

## 2024-06-26 MED ORDER — VITAMIN D (ERGOCALCIFEROL) 1.25 MG (50000 UNIT) PO CAPS: 50000.0000 [IU] | ORAL_CAPSULE | ORAL | 3 refills | Status: AC

## 2024-06-26 NOTE — Progress Notes (Signed)
 Results through MyChart  Send copy of results and notes to Dr. Nichole

## 2024-06-30 LAB — COMPREHENSIVE METABOLIC PANEL WITH GFR
ALT: 38 IU/L (ref 0–44)
AST: 27 IU/L (ref 0–40)
Albumin: 4.6 g/dL (ref 4.1–5.1)
Alkaline Phosphatase: 60 IU/L (ref 47–123)
BUN/Creatinine Ratio: 14 (ref 9–20)
BUN: 12 mg/dL (ref 6–24)
Bilirubin Total: 0.4 mg/dL (ref 0.0–1.2)
CO2: 21 mmol/L (ref 20–29)
Calcium: 9.7 mg/dL (ref 8.7–10.2)
Chloride: 104 mmol/L (ref 96–106)
Creatinine, Ser: 0.85 mg/dL (ref 0.76–1.27)
Globulin, Total: 2.3 g/dL (ref 1.5–4.5)
Glucose: 82 mg/dL (ref 70–99)
Potassium: 4.6 mmol/L (ref 3.5–5.2)
Sodium: 141 mmol/L (ref 134–144)
Total Protein: 6.9 g/dL (ref 6.0–8.5)
eGFR: 107 mL/min/1.73 (ref >59)

## 2024-06-30 LAB — LIPID PANEL
Chol/HDL Ratio: 3.5 ratio (ref 0.0–5.0)
Cholesterol, Total: 147 mg/dL (ref 100–199)
HDL: 42 mg/dL (ref >39)
LDL Chol Calc (NIH): 84 mg/dL (ref 0–99)
Triglycerides: 114 mg/dL (ref 0–149)
VLDL Cholesterol Cal: 21 mg/dL (ref 5–40)

## 2024-06-30 LAB — HEMOGLOBIN A1C
Est. average glucose Bld gHb Est-mCnc: 108 mg/dL
Hgb A1c MFr Bld: 5.4 % (ref 4.8–5.6)

## 2024-06-30 LAB — CBC
Hematocrit: 50.7 % (ref 37.5–51.0)
Hemoglobin: 17.1 g/dL (ref 13.0–17.7)
MCH: 31 pg (ref 26.6–33.0)
MCHC: 33.7 g/dL (ref 31.5–35.7)
MCV: 92 fL (ref 79–97)
Platelets: 241 x10E3/uL (ref 150–450)
RBC: 5.51 x10E6/uL (ref 4.14–5.80)
RDW: 13.5 % (ref 11.6–15.4)
WBC: 6 x10E3/uL (ref 3.4–10.8)

## 2024-06-30 LAB — TESTOSTERONE: Testosterone: 343 ng/dL (ref 264–916)

## 2024-06-30 LAB — COMPREHENSIVE THYROGLOBULIN
Anti-Thyroglobulin Antibodies: <1 [IU]/mL
Thyroglobulin (ICMA): <0.1 ng/mL

## 2024-06-30 LAB — PSA: Prostate Specific Ag, Serum: 0.4 ng/mL (ref 0.0–4.0)

## 2024-06-30 LAB — VITAMIN D 25 HYDROXY (VIT D DEFICIENCY, FRACTURES): Vit D, 25-Hydroxy: 32.5 ng/mL (ref 30.0–100.0)

## 2024-06-30 LAB — TSH+FREE T4
Free T4: 1.99 ng/dL — ABNORMAL HIGH (ref 0.82–1.77)
TSH: <0.005 u[IU]/mL — ABNORMAL LOW (ref 0.450–4.500)

## 2024-07-02 ENCOUNTER — Telehealth (HOSPITAL_COMMUNITY): Payer: Self-pay | Admitting: Pharmacy Technician

## 2024-07-02 ENCOUNTER — Other Ambulatory Visit (HOSPITAL_COMMUNITY): Payer: Self-pay

## 2024-07-02 NOTE — Telephone Encounter (Signed)
 Pharmacy Patient Advocate Encounter   Received notification from Patient Pharmacy that prior authorization for valACYclovir  is required/requested.   Per test claim: The current 7 day co-pay is, $10.  No PA needed at this time. This test claim was processed through [facility]- copay amounts may vary at other pharmacies due to pharmacy/plan contracts, or as the patient moves through the different stages of their insurance plan.  Called the pharmacy to let them know the ins would only let us  fill a 30 day supply.

## 2024-07-02 NOTE — Progress Notes (Signed)
 Make sure we forward a copy of his labs to his endocrinologist Dr. Nichole

## 2024-07-23 ENCOUNTER — Other Ambulatory Visit: Payer: Self-pay | Admitting: Medical

## 2024-07-23 MED ORDER — ZEPBOUND 10 MG/0.5ML ~~LOC~~ SOLN: 10.0000 mg | SUBCUTANEOUS | 0 refills | Status: DC

## 2024-08-15 ENCOUNTER — Ambulatory Visit: Admitting: Medical

## 2024-08-15 VITALS — BP 110/70 | HR 60 | Wt 251.8 lb

## 2024-08-15 DIAGNOSIS — E039 Hypothyroidism, unspecified: Secondary | ICD-10-CM | POA: Diagnosis not present

## 2024-08-15 DIAGNOSIS — E559 Vitamin D deficiency, unspecified: Secondary | ICD-10-CM

## 2024-08-15 DIAGNOSIS — E782 Mixed hyperlipidemia: Secondary | ICD-10-CM | POA: Diagnosis not present

## 2024-08-15 DIAGNOSIS — Z6835 Body mass index (BMI) 35.0-35.9, adult: Secondary | ICD-10-CM

## 2024-08-15 MED ORDER — MOUNJARO 12.5 MG/0.5ML ~~LOC~~ SOAJ: 12.5000 mg | SUBCUTANEOUS | 0 refills | Status: DC

## 2024-08-15 NOTE — Progress Notes (Signed)
 Subjective: Chief Complaint  Patient presents with   Follow-up    2 month follow-up   History of Present Illness Bradley Watts is a 48 year old male who presents for a weight loss follow-up.  He is currently on Zepbound  10 mg weekly. Initially, at 7 mg, he felt 'really slow,' but adjusted by the end of the month. Now at zepbound  10 mg, he feels it is 'kind of like normal.' He has been on this dose for three weeks, having taken his third shot recently, with one more to go.  He exercises two to three times a week, primarily two days, and acknowledges that he should increase this to three or four days. His dietary intake is limited to about 1500 calories a day, which he finds manageable due to reduced hunger. He focuses on healthy choices and calorie counting rather than eliminating specific food groups. He notes that without the medication, he would be 'raging hungry.'  His goal is to reach a weight in the 225lb.   Currently, his BMI is 35, having decreased from 287 lbs in December to 251 lbs now. He engages in some weight-bearing exercises, using dumbbells.  He has a history of thyroid  issues, on levothyroxine, sees endo.  No other aggravating or relieving factors. No other complaint.  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 Dallas Hoar   Recurrent cold sores    Wears glasses    Current Outpatient Medications on File Prior to Visit  Medication Sig Dispense Refill   dexlansoprazole  (DEXILANT ) 60 MG capsule Take 1 capsule by mouth 2 (two) times daily.     levothyroxine (SYNTHROID, LEVOTHROID) 200 MCG tablet Take 200 mcg by mouth daily.     rosuvastatin  (CRESTOR ) 10 MG tablet Take 1 tablet (10 mg total) by mouth daily. 90 tablet 3   tirzepatide  (ZEPBOUND ) 10 MG/0.5ML injection vial Inject 10 mg into the skin  once a week. 2 mL 0   Vitamin D , Ergocalciferol , (DRISDOL ) 1.25 MG (50000 UNIT) CAPS capsule Take 1 capsule (50,000 Units total) by mouth every 7 (seven) days. 12 capsule 3   valACYclovir  (VALTREX ) 1000 MG tablet Take 1 tablet (1,000 mg total) by mouth daily. 90 tablet 1   No current facility-administered medications on file prior to visit.   ROS as in subjective  Objective BP 110/70   Pulse 60   Wt 251 lb 12.8 oz (114.2 kg)   BMI 35.12 kg/m   Gen: wd, wn ,nad Psych: pleasant, good eye contact, answers questions appropriately    Assessment and Plan Encounter Diagnoses  Name Primary?   Vitamin D  deficiency Yes   Hypothyroidism, unspecified type    Mixed hyperlipidemia    BMI 35.0-35.9,adult    Vit D deficiency - continue current medication supplement vit D 50,000 weekly  Hypothyroidism - Continue to monitor thyroid  function tests regularly, sees endo -continue levothyroxine 200mcg daily  Mixed dyslipidemia  - continue crestor  10mg  daily  Obesity Weight reduced from 287 lbs to 251 lbs from December to now. Goal weight 225 lbs. Discussed potential side effects of increased dose, including pancreatitis and liver inflammation. Emphasized exercise and protein intake. - Increase Zepbound   12.5 mg x 1 month, then increase to 15mg  weekly. - Encouraged exercise 3-4 times a week, including 45-60 minutes of cardio and weight-bearing exercises  twice a week. - Maintain 1500 calorie diet, try to reach 80-100 grams of protein daily. - Monitor for side effects, especially nausea, indigestion, heartburn, acid reflux, constipation, pancreatitis, and liver inflammation. - Schedule follow-up in 3 months after reaching 15 mg dose.   Bradley Watts was seen today for follow-up.  Diagnoses and all orders for this visit:  Vitamin D  deficiency  Hypothyroidism, unspecified type  Mixed hyperlipidemia  BMI 35.0-35.9,adult  Other orders -     tirzepatide  (MOUNJARO) 12.5 MG/0.5ML Pen; Inject 12.5  mg into the skin once a week.   F/u 71mo

## 2024-08-17 ENCOUNTER — Other Ambulatory Visit (HOSPITAL_COMMUNITY): Payer: Self-pay

## 2024-08-20 ENCOUNTER — Other Ambulatory Visit: Payer: Self-pay | Admitting: Medical

## 2024-08-20 MED ORDER — ZEPBOUND 12.5 MG/0.5ML ~~LOC~~ SOAJ: 12.5000 mg | SUBCUTANEOUS | 1 refills | Status: DC

## 2024-08-21 ENCOUNTER — Ambulatory Visit: Payer: Self-pay | Admitting: Medical

## 2024-09-02 ENCOUNTER — Other Ambulatory Visit: Payer: Self-pay | Admitting: Medical

## 2024-09-03 NOTE — Telephone Encounter (Signed)
 Has an appt in February.

## 2024-10-16 ENCOUNTER — Other Ambulatory Visit: Payer: Self-pay | Admitting: Medical

## 2024-10-16 MED ORDER — ZEPBOUND 12.5 MG/0.5ML ~~LOC~~ SOAJ: 12.5000 mg | SUBCUTANEOUS | 1 refills | Status: DC

## 2024-10-18 ENCOUNTER — Telehealth: Payer: Self-pay | Admitting: Internal Medicine

## 2024-10-18 NOTE — Telephone Encounter (Unsigned)
 Copied from CRM #[id number]. Topic: Clinical - Prescription Issue >> Oct 18, 2024  9:16 AM Ahlexyia S wrote: Reason for CRM: Pt called in stating that his insurance no longer covers medication tirzepatide  (ZEPBOUND ) 12.5 MG/0.5ML Pen. Pt stated he was provided a phone number to appeal the decision. Pt was told that someone from his providers office has to contact them. Pt is requesting a callback when this has been done.  [facility]; Appeal Dept [phone]

## 2024-10-22 NOTE — Telephone Encounter (Signed)
 Copied from CRM #[id number]. Topic: Clinical - Prescription Issue >> Oct 22, 2024  2:51 PM Bradley Watts wrote: Patient states the appeal  needs to submitted via fax - [phone] . At the top needs to say urgent . Case ID #[id number] . Level 1 appeal . Patients name and DOB . Also include [facility] id number [id number]-- good explanation why patient needs to continue the medication.

## 2024-10-22 NOTE — Telephone Encounter (Signed)
 Copied from CRM #[id number]. Topic: Clinical - Prescription Issue >> Oct 22, 2024  1:56 PM Berwyn MATSU wrote: Reason for CRM: Pt called in stating that his insurance no longer covers medication tirzepatide  (ZEPBOUND ) 12.5 MG/0.5ML Pen. Pt stated he was provided a phone number to appeal the decision. Pt was told that someone from his providers office has to contact them. Pt is requesting a callback when this has been done. Patient is also requesting to speak to MD team and or nurse as he has been calling about this PA/ appeal for the medication and has received no update. Patient also indicates that insurance has not received anything.   [facility]: Appeal Dept PH:[phone] May you please assist.

## 2024-10-22 NOTE — Telephone Encounter (Signed)
 See mychart message from 10/16/24 for PA

## 2024-10-23 NOTE — Telephone Encounter (Signed)
 Please contact patient about the status of his PA.

## 2024-10-23 NOTE — Telephone Encounter (Signed)
 Copied from CRM #[id number]. Topic: Clinical - Prescription Issue >> Oct 23, 2024  1:56 PM Joesph B wrote: Patient would like for someone to call him in regards to this PA. He states information provided by PCP needs be sent via fax. I provided the info yesterday. Please call pt in regards to the next steps!

## 2024-10-23 NOTE — Telephone Encounter (Signed)
 Spoke to patient. If you can write a letter and I will fax in. I have sent 5 message since last week to our PA team and I do not see anything done on this, so I want to take it in my own hands and fax it in.  Please At the top needs to say urgent . Case ID #[id number] .  Level 1 appeal .  Patients name and DOB .   [facility] id number [id number] good explanation why patient needs to continue the medication.   And I will fax it in

## 2024-10-24 ENCOUNTER — Encounter: Payer: Self-pay | Admitting: Medical

## 2024-10-24 NOTE — Telephone Encounter (Signed)
 Faxed over appeals letter to [facility] [employer] per patient's request

## 2024-10-25 MED ORDER — ZEPBOUND 12.5 MG/0.5ML ~~LOC~~ SOAJ: 12.5000 mg | SUBCUTANEOUS | 1 refills | Status: AC

## 2024-10-25 MED ORDER — "SYRINGE/NEEDLE (DISP) 25G X 5/8"" 1 ML MISC": 0 refills | Status: AC

## 2024-10-30 ENCOUNTER — Other Ambulatory Visit: Payer: Self-pay | Admitting: Medical

## 2024-11-13 ENCOUNTER — Ambulatory Visit: Admitting: Medical
